# Patient Record
Sex: Female | Born: 1960
Health system: Southern US, Community
[De-identification: ages and names within clinical notes are randomized; demographics above are authoritative.]

## PROBLEM LIST (undated history)

## (undated) DIAGNOSIS — Z923 Personal history of irradiation: Secondary | ICD-10-CM

## (undated) DIAGNOSIS — C50919 Malignant neoplasm of unspecified site of unspecified female breast: Secondary | ICD-10-CM

## (undated) DIAGNOSIS — I10 Essential (primary) hypertension: Secondary | ICD-10-CM

## (undated) DIAGNOSIS — Z8669 Personal history of other diseases of the nervous system and sense organs: Secondary | ICD-10-CM

## (undated) DIAGNOSIS — Z460 Encounter for fitting and adjustment of spectacles and contact lenses: Secondary | ICD-10-CM

## (undated) DIAGNOSIS — M65319 Trigger thumb, unspecified thumb: Secondary | ICD-10-CM

## (undated) DIAGNOSIS — M1990 Unspecified osteoarthritis, unspecified site: Secondary | ICD-10-CM

## (undated) HISTORY — DX: Unspecified osteoarthritis, unspecified site: M19.90

## (undated) HISTORY — PX: COLONOSCOPY: SHX174

## (undated) HISTORY — DX: Essential (primary) hypertension: I10

## (undated) HISTORY — DX: Personal history of other diseases of the nervous system and sense organs: Z86.69

## (undated) HISTORY — DX: Trigger thumb, unspecified thumb: M65.319

## (undated) HISTORY — DX: Malignant neoplasm of unspecified site of unspecified female breast: C50.919

---
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
## 1991-01-31 HISTORY — PX: BREAST EXCISIONAL BIOPSY: SUR124

## 1999-05-12 ENCOUNTER — Other Ambulatory Visit: Admission: RE | Admit: 1999-05-12 | Discharge: 1999-05-12 | Payer: Self-pay | Admitting: Gynecology

## 1999-05-19 ENCOUNTER — Encounter (INDEPENDENT_AMBULATORY_CARE_PROVIDER_SITE_OTHER): Payer: Self-pay

## 1999-05-19 ENCOUNTER — Other Ambulatory Visit: Admission: RE | Admit: 1999-05-19 | Discharge: 1999-05-19 | Payer: Self-pay | Admitting: Gynecology

## 1999-06-09 ENCOUNTER — Other Ambulatory Visit: Admission: RE | Admit: 1999-06-09 | Discharge: 1999-06-09 | Payer: Self-pay | Admitting: Gynecology

## 1999-11-01 ENCOUNTER — Other Ambulatory Visit: Admission: RE | Admit: 1999-11-01 | Discharge: 1999-11-01 | Payer: Self-pay | Admitting: Gynecology

## 2000-05-08 ENCOUNTER — Other Ambulatory Visit: Admission: RE | Admit: 2000-05-08 | Discharge: 2000-05-08 | Payer: Self-pay | Admitting: Gynecology

## 2000-05-11 ENCOUNTER — Encounter (INDEPENDENT_AMBULATORY_CARE_PROVIDER_SITE_OTHER): Payer: Self-pay

## 2000-05-11 ENCOUNTER — Other Ambulatory Visit: Admission: RE | Admit: 2000-05-11 | Discharge: 2000-05-11 | Payer: Self-pay | Admitting: Gynecology

## 2000-11-19 ENCOUNTER — Other Ambulatory Visit: Admission: RE | Admit: 2000-11-19 | Discharge: 2000-11-19 | Payer: Self-pay | Admitting: Gynecology

## 2001-05-09 ENCOUNTER — Other Ambulatory Visit: Admission: RE | Admit: 2001-05-09 | Discharge: 2001-05-09 | Payer: Self-pay | Admitting: Gynecology

## 2002-07-02 ENCOUNTER — Other Ambulatory Visit: Admission: RE | Admit: 2002-07-02 | Discharge: 2002-07-02 | Payer: Self-pay | Admitting: Gynecology

## 2005-01-02 ENCOUNTER — Other Ambulatory Visit: Admission: RE | Admit: 2005-01-02 | Discharge: 2005-01-02 | Payer: Self-pay | Admitting: Gynecology

## 2005-01-30 HISTORY — PX: MINOR FULGERATION OF ANAL CONDYLOMA: SHX6467

## 2005-11-20 ENCOUNTER — Other Ambulatory Visit: Admission: RE | Admit: 2005-11-20 | Discharge: 2005-11-20 | Payer: Self-pay | Admitting: Gynecology

## 2005-12-07 ENCOUNTER — Ambulatory Visit (HOSPITAL_COMMUNITY): Admission: RE | Admit: 2005-12-07 | Discharge: 2005-12-07 | Payer: Self-pay | Admitting: General Surgery

## 2005-12-07 ENCOUNTER — Encounter (INDEPENDENT_AMBULATORY_CARE_PROVIDER_SITE_OTHER): Payer: Self-pay | Admitting: Specialist

## 2007-09-09 ENCOUNTER — Other Ambulatory Visit: Admission: RE | Admit: 2007-09-09 | Discharge: 2007-09-09 | Payer: Self-pay | Admitting: Gynecology

## 2009-09-21 ENCOUNTER — Ambulatory Visit (HOSPITAL_COMMUNITY): Admission: RE | Admit: 2009-09-21 | Discharge: 2009-09-21 | Payer: Self-pay | Admitting: Urology

## 2010-06-17 NOTE — Op Note (Signed)
NAMEJOHNI, NARINE                  ACCOUNT NO.:  [id number]   MEDICAL RECORD NO.:  [id number]          PATIENT TYPE:  AMB   LOCATION:  DAY                          FACILITY:  [facility]   PHYSICIAN:  Ollen Gross. Vernell Morgans, M.D. DATE OF BIRTH:  23-Nov-1960   DATE OF PROCEDURE:  12/07/2005  DATE OF DISCHARGE:                               OPERATIVE REPORT   PREOPERATIVE DIAGNOSIS:  Anal condyloma.   POSTOPERATIVE DIAGNOSIS:  Anal condyloma.   PROCEDURE:  Destruction of anal condyloma.   SURGEON:  Dr. Carolynne Edouard.   ANESTHESIA:  General via LMA.   PROCEDURE:  After informed consent was obtained, the patient was brought  to the operating room, placed in the supine position on the operating  room table.  After induction of general anesthesia, the patient was  moved into a lithotomy position.  Her perirectal area was prepped with  Betadine and draped in usual sterile manner.  The patient had several  areas of anal condyloma around the rectum.  The perirectal area was  infiltrated with quarter percent Marcaine with epinephrine, and tissue  was massaged gently for several minutes.  A bullet-type retractor was  then placed in the rectum.  There did not appear to be any disease that  extended into the rectum.  The three largest areas of anal condyloma  were excised sharply with Metzenbaum scissors and sent to pathology for  further evaluation.  All the areas were then fulgurated with  electrocautery until no other areas of anal condyloma were left.  Once  this was accomplished, the operative area was coated with dibucaine  ointment, and sterile dressings were applied.  The patient tolerated  well.  At the end of the case, all needle, sponge and instrument counts  were correct.  The patient was then awakened and taken to the recovery  in stable condition.      Ollen Gross. Vernell Morgans, M.D.  Electronically Signed     PST/MEDQ  D:  12/07/2005  T:  12/08/2005  Job:  [id number]

## 2010-09-20 ENCOUNTER — Other Ambulatory Visit: Payer: Self-pay | Admitting: Gynecology

## 2010-09-20 DIAGNOSIS — R928 Other abnormal and inconclusive findings on diagnostic imaging of breast: Secondary | ICD-10-CM

## 2010-09-26 ENCOUNTER — Ambulatory Visit
Admission: RE | Admit: 2010-09-26 | Discharge: 2010-09-26 | Disposition: A | Discharge status: Home / Self Care | Payer: BC Managed Care – PPO | Source: Ambulatory Visit | Attending: Gynecology | Admitting: Gynecology

## 2010-09-26 DIAGNOSIS — R928 Other abnormal and inconclusive findings on diagnostic imaging of breast: Secondary | ICD-10-CM

## 2011-01-31 HISTORY — PX: BUNIONECTOMY: SHX129

## 2012-09-24 ENCOUNTER — Other Ambulatory Visit: Payer: Self-pay | Admitting: Gynecology

## 2012-09-24 ENCOUNTER — Other Ambulatory Visit: Payer: Self-pay | Admitting: Internal Medicine

## 2012-09-24 DIAGNOSIS — Z803 Family history of malignant neoplasm of breast: Secondary | ICD-10-CM

## 2012-10-15 ENCOUNTER — Ambulatory Visit
Admission: RE | Admit: 2012-10-15 | Discharge: 2012-10-15 | Disposition: A | Discharge status: Home / Self Care | Payer: BC Managed Care – PPO | Source: Ambulatory Visit | Attending: Gynecology | Admitting: Gynecology

## 2012-10-15 DIAGNOSIS — Z803 Family history of malignant neoplasm of breast: Secondary | ICD-10-CM

## 2012-10-15 MED ORDER — GADOBENATE DIMEGLUMINE 529 MG/ML IV SOLN: 13.0000 mL | Freq: Once | INTRAVENOUS | Status: AC | PRN

## 2012-10-15 MED ORDER — GADOBENATE DIMEGLUMINE 529 MG/ML IV SOLN
13.0000 mL | Freq: Once | INTRAVENOUS | Status: AC | PRN
  Administered 2012-10-15: 13 mL via INTRAVENOUS

## 2012-10-16 ENCOUNTER — Other Ambulatory Visit: Payer: Self-pay | Admitting: Gynecology

## 2012-10-16 DIAGNOSIS — R928 Other abnormal and inconclusive findings on diagnostic imaging of breast: Secondary | ICD-10-CM

## 2012-10-21 ENCOUNTER — Ambulatory Visit
Admission: RE | Admit: 2012-10-21 | Discharge: 2012-10-21 | Disposition: A | Discharge status: Home / Self Care | Payer: BC Managed Care – PPO | Source: Ambulatory Visit | Attending: Gynecology | Admitting: Gynecology

## 2012-10-21 ENCOUNTER — Other Ambulatory Visit: Payer: Self-pay | Admitting: Gynecology

## 2012-10-21 DIAGNOSIS — R928 Other abnormal and inconclusive findings on diagnostic imaging of breast: Secondary | ICD-10-CM

## 2012-10-21 DIAGNOSIS — C50919 Malignant neoplasm of unspecified site of unspecified female breast: Secondary | ICD-10-CM | POA: Insufficient documentation

## 2012-10-24 ENCOUNTER — Telehealth: Payer: Self-pay | Admitting: *Deleted

## 2012-10-24 DIAGNOSIS — C50412 Malignant neoplasm of upper-outer quadrant of left female breast: Secondary | ICD-10-CM | POA: Insufficient documentation

## 2012-10-24 DIAGNOSIS — Z170 Estrogen receptor positive status [ER+]: Secondary | ICD-10-CM | POA: Insufficient documentation

## 2012-10-24 NOTE — Telephone Encounter (Signed)
Confirmed [facility] for 10/30/12 at 0800.  Instructions and contact information given. 

## 2012-10-30 ENCOUNTER — Ambulatory Visit (HOSPITAL_BASED_OUTPATIENT_CLINIC_OR_DEPARTMENT_OTHER): Payer: BC Managed Care – PPO | Admitting: Surgery

## 2012-10-30 ENCOUNTER — Ambulatory Visit: Payer: BC Managed Care – PPO | Attending: Surgery | Admitting: Physical Therapy

## 2012-10-30 ENCOUNTER — Encounter: Payer: Self-pay | Admitting: Oncology

## 2012-10-30 ENCOUNTER — Encounter (INDEPENDENT_AMBULATORY_CARE_PROVIDER_SITE_OTHER): Payer: Self-pay | Admitting: Surgery

## 2012-10-30 ENCOUNTER — Encounter: Payer: Self-pay | Admitting: *Deleted

## 2012-10-30 ENCOUNTER — Other Ambulatory Visit (HOSPITAL_BASED_OUTPATIENT_CLINIC_OR_DEPARTMENT_OTHER): Payer: BC Managed Care – PPO | Admitting: Lab

## 2012-10-30 ENCOUNTER — Ambulatory Visit: Payer: BC Managed Care – PPO

## 2012-10-30 ENCOUNTER — Ambulatory Visit
Admission: RE | Admit: 2012-10-30 | Discharge: 2012-10-30 | Disposition: A | Discharge status: Home / Self Care | Payer: BC Managed Care – PPO | Source: Ambulatory Visit | Attending: Radiation Oncology | Admitting: Radiation Oncology

## 2012-10-30 ENCOUNTER — Ambulatory Visit (HOSPITAL_BASED_OUTPATIENT_CLINIC_OR_DEPARTMENT_OTHER): Payer: BC Managed Care – PPO | Admitting: Oncology

## 2012-10-30 VITALS — BP 146/93 | HR 66 | Temp 97.5°F | Resp 20 | Ht 63.0 in | Wt 152.0 lb

## 2012-10-30 VITALS — BP 146/93 | HR 66 | Temp 97.5°F | Resp 20 | Ht 63.0 in | Wt 151.6 lb

## 2012-10-30 DIAGNOSIS — C50419 Malignant neoplasm of upper-outer quadrant of unspecified female breast: Secondary | ICD-10-CM

## 2012-10-30 DIAGNOSIS — Z170 Estrogen receptor positive status [ER+]: Secondary | ICD-10-CM

## 2012-10-30 DIAGNOSIS — C50412 Malignant neoplasm of upper-outer quadrant of left female breast: Secondary | ICD-10-CM

## 2012-10-30 DIAGNOSIS — C50919 Malignant neoplasm of unspecified site of unspecified female breast: Secondary | ICD-10-CM | POA: Insufficient documentation

## 2012-10-30 DIAGNOSIS — IMO0001 Reserved for inherently not codable concepts without codable children: Secondary | ICD-10-CM | POA: Insufficient documentation

## 2012-10-30 LAB — COMPREHENSIVE METABOLIC PANEL (CC13)
Albumin: 3.8 g/dL (ref 3.5–5.0)
Alkaline Phosphatase: 45 U/L (ref 40–150)
Calcium: 9.1 mg/dL (ref 8.4–10.4)
Glucose: 78 mg/dl (ref 70–140)

## 2012-10-30 LAB — CBC WITH DIFFERENTIAL/PLATELET
HCT: 42.4 % (ref 34.8–46.6)
MCH: 32 pg (ref 25.1–34.0)
NEUT#: 3.9 10*3/uL (ref 1.5–6.5)
NEUT%: 58.5 % (ref 38.4–76.8)
RBC: 4.59 10*6/uL (ref 3.70–5.45)
RDW: 12.8 % (ref 11.2–14.5)
WBC: 6.6 10*3/uL (ref 3.9–10.3)
lymph#: 2.3 10*3/uL (ref 0.9–3.3)

## 2012-10-30 NOTE — Progress Notes (Signed)
Checked in new pt with no financial concerns. °

## 2012-10-30 NOTE — Progress Notes (Signed)
[facility] Radiation Oncology NEW PATIENT EVALUATION  Name: Margaret Reynolds MRN: [id number]  Date:   10/30/2012           DOB: 1960/03/08  Status: outpatient   CC: Pcp Not In System  Streck, Reola Mosher, MD Dr. Shanon Payor [facility])   REFERRING PHYSICIAN: Jamey Ripa, Reola Mosher, MD   DIAGNOSIS: Stage I (T1, N0, M0) invasive ductal/DCIS of the left breast   HISTORY OF PRESENT ILLNESS:  Margaret Reynolds is a 52 y.o. female who is seen today at the [facility] for the courtesy Dr. Jamey Ripa for evaluation of her T1 N0 invasive ductal/DCIS of the left breast. Diagnostic mammograms have shown dense breasts. With her family history of breast cancer she had breast MR on 10/15/2012. This showed a 1.9 cm x 1.6 cm mass at 2:00 within the upper-outer quadrant of the left breast. There was also a 0.9 cm intramammary lymph node at 3:00 posteriorly, unchanged. A left axillary lymph node showed eccentric thickening, slightly suspicious. On 10/21/2012 she underwent a biopsy of the mass at 2:00 and also left axillary lymph node. The left axilla lymph node was benign. The 2:00 mass was diagnostic for invasive ductal/DCIS with calcifications. Her tumor was strongly ER positive at 100%, PR +100% with a low Ki-67 of 10%. HER-2/neu is pending. She seen today with Dr. Jamey Ripa and Dr. Welton Flakes. Of note is that her mother underwent a partial mastectomy and breast radiation by Dr. Clovis Riley at the [facility] approximately 2 years ago at age 8.  PREVIOUS RADIATION THERAPY: No   PAST MEDICAL HISTORY:  has a past medical history of Breast cancer; Hypertension; and Arthritis.     PAST SURGICAL HISTORY:  Past Surgical History  Procedure Laterality Date  . Breast lumpectomy      Age 49, 54     FAMILY HISTORY: family history includes Breast cancer in her mother; Cervical cancer in her maternal grandmother; Melanoma in her father. Her mother was treated following conservative surgery with  radiation therapy at the [facility] by Dr. Clovis Riley at age 20, 2 years ago. She is doing well.   SOCIAL HISTORY:  reports that she has quit smoking. She does not have any smokeless tobacco history on file. She reports that she drinks about 4.2 ounces of alcohol per week. She reports that she does not use illicit drugs. Married, 2 children, ages 29 and 72. She works as an Biochemist, clinical for a [employer]   ALLERGIES: Review of patient's allergies indicates no known allergies.   MEDICATIONS:  Current Outpatient Prescriptions  Medication Sig Dispense Refill  . Calcium Citrate-Vitamin D (CITRACAL + D PO) Take by mouth.      . enalapril (VASOTEC) 5 MG tablet Take 5 mg by mouth daily.       No current facility-administered medications for this encounter.     REVIEW OF SYSTEMS:  Pertinent items are noted in HPI.    PHYSICAL EXAM: Alert and oriented 52 year old white female appearing her stated age. Wt Readings from Last 3 Encounters:  10/30/12 152 lb (68.947 kg)  10/30/12 151 lb 9.6 oz (68.765 kg)   Temp Readings from Last 3 Encounters:  10/30/12 97.5 F (36.4 C)   10/30/12 97.5 F (36.4 C) Oral   BP Readings from Last 3 Encounters:  10/30/12 146/93  10/30/12 146/93   Pulse Readings from Last 3 Encounters:  10/30/12 66  10/30/12 66   Head and neck examination: Grossly unremarkable. Nodes:  Without palpable cervical, supraclavicular, or axillary lymphadenopathy. Chest: Lungs clear. Heart: Regular rate and rhythm. Back: Without spinal or CVA tenderness. Breasts: On inspection of left breast there is an area of ecchymosis at 2:00 from her needle biopsy wound. There is no palpable mass. Right breast without masses or lesions. Abdomen without hepatomegaly. Extremities: Without edema.    LABORATORY DATA:  Lab Results  Component Value Date   WBC 6.6 10/30/2012   HGB 14.7 10/30/2012   HCT 42.4 10/30/2012   MCV 92.4 10/30/2012   PLT 259 10/30/2012   Lab Results   Component Value Date   NA 140 10/30/2012   K 4.3 10/30/2012   CO2 26 10/30/2012   Lab Results  Component Value Date   ALT 22 10/30/2012   AST 21 10/30/2012   ALKPHOS 45 10/30/2012   BILITOT 0.62 10/30/2012      IMPRESSION: Stage I (T1, N0, M0) invasive ductal/DCIS of the left breast. I explained to the patient that her local treatment options include mastectomy versus partial mastectomy followed by radiation therapy. She appears to  be a candidate for hypo-fractionated radiation therapy. She can certainly be treated in [place]  by Dr. Clovis Riley. In the event that the cardiac silhouette is within the tangential fields, she could be treated in [place] with "deep inspiration/breath-hold" technology for this left-sided breast cancer. We briefly discussed the potential acute and late toxicities of radiation therapy. She'll proceed with conservative surgery with Dr. Jamey Ripa which will include a sentinel lymph node.   PLAN: As discussed above . An appointment will be made for her to see Dr. Clovis Riley following her definitive surgery. I spent 40 minutes minutes face to face with the patient and more than 50% of that time was spent in counseling and/or coordination of care.

## 2012-10-30 NOTE — Progress Notes (Addendum)
Patient ID: Margaret Reynolds, female   DOB: Mar 02, 1960, 52 y.o.   MRN: [id number]  Chief Complaint  Patient presents with  . Breast Cancer    Left    HPI Margaret Reynolds is a 52 y.o. female. She is referred by Dr Jeanie Sewer for consultation for management of her newly diagnosed breast cancer. A recent mammogram showed an abnormality in the upper outer quadrant of the left breast. A needle core biopsy has shown invasive ductal carcinoma, receptor positive, HER-2 negative, with a low growth phase. She is not having any breast symptoms and was unaware of a mass. She has had bilateral breast biopsies in the past, both for benign disease. Her mother was recently treated for breast cancer at approximately age 54. She is doing well.  The patient has no other breast tissue she concerns currently. She considers herself to be in good health with the exception of some mild increased blood pressure.  HPI  Past Medical History  Diagnosis Date  . Breast cancer   . Hypertension   . Arthritis     Past Surgical History  Procedure Laterality Date  . Breast lumpectomy      Age 5, 42    Family History  Problem Relation Age of Onset  . Breast cancer Mother   . Melanoma Father   . Cervical cancer Maternal Grandmother     Social History History  Substance Use Topics  . Smoking status: Former Games developer  . Smokeless tobacco: Not on file  . Alcohol Use: 4.2 oz/week    7 Cans of beer per week    No Known Allergies  Current Outpatient Prescriptions  Medication Sig Dispense Refill  . Calcium Citrate-Vitamin D (CITRACAL + D PO) Take by mouth.      . enalapril (VASOTEC) 5 MG tablet Take 5 mg by mouth daily.       No current facility-administered medications for this visit.    Review of Systems Review of Systems  Constitutional: Negative for fever, chills and unexpected weight change.  HENT: Negative for hearing loss, congestion, sore throat, trouble swallowing and voice change.   Eyes: Negative for  visual disturbance.  Respiratory: Negative for cough and wheezing.   Cardiovascular: Negative for chest pain, palpitations and leg swelling.  Gastrointestinal: Negative for nausea, vomiting, abdominal pain, diarrhea, constipation, blood in stool, abdominal distention and anal bleeding.  Genitourinary: Negative for hematuria, vaginal bleeding and difficulty urinating.  Musculoskeletal: Negative for arthralgias.  Skin: Negative for rash and wound.  Neurological: Negative for seizures, syncope and headaches.  Hematological: Negative for adenopathy. Does not bruise/bleed easily.  Psychiatric/Behavioral: Negative for confusion.       Some phobias    Blood pressure 146/93, pulse 66, temperature 97.5 F (36.4 C), resp. rate 20, height 5\' 3"  (1.6 m), weight 152 lb (68.947 kg).  Physical Exam Physical Exam  Vitals reviewed. Constitutional: She is oriented to person, place, and time. She appears well-developed and well-nourished. No distress.  HENT:  Head: Normocephalic and atraumatic.  Mouth/Throat: Oropharynx is clear and moist.  Eyes: Conjunctivae and EOM are normal. Pupils are equal, round, and reactive to light. No scleral icterus.  Neck: Normal range of motion. Neck supple. No tracheal deviation present. No thyromegaly present.  Cardiovascular: Normal rate, regular rhythm, normal heart sounds and intact distal pulses.  Exam reveals no gallop and no friction rub.   No murmur heard. Pulmonary/Chest: Effort normal and breath sounds normal. No respiratory distress. She has no wheezes. She has no  rales. Right breast exhibits no inverted nipple, no mass, no nipple discharge and no skin change. Left breast exhibits no inverted nipple, no mass, no nipple discharge, no skin change and no tenderness. Breasts are asymmetrical.    Abdominal: Soft. Bowel sounds are normal. She exhibits no distension and no mass. There is no tenderness. There is no rebound and no guarding.  Musculoskeletal: Normal range  of motion. She exhibits no edema and no tenderness.  Lymphadenopathy:    She has no cervical adenopathy.    She has no axillary adenopathy.       Right: No supraclavicular adenopathy present.       Left: No supraclavicular adenopathy present.  Neurological: She is alert and oriented to person, place, and time.  Skin: Skin is warm and dry. No rash noted. She is not diaphoretic. No erythema.  Psychiatric: She has a normal mood and affect. Her behavior is normal. Judgment and thought content normal.    Data Reviewed I have reviewed the mammogram and ultrasound films and reports and discuss them with her radiologist, I reviewed the MRI results and reviewed it with the radiologist. I have reviewed the pathology slides and discuss them with the pathologist. I also discussed her situation with both medical and radiation oncologist.  Assessment    Clinical stage I left breast cancer, upper outer quadrant, receptor positive, HER-2 negative Family history of breast cancer in her mother Hypertension well controlled     Plan    I have explained the pathophysiology and staging of breast cancer with particular attention to her exact situation. We discussed the multidisciplinary approach to breast cancer which often includes both medical and radiation oncology consultations.  We also discussed surgical options for the treatment of breast cancer including lumpectomy and mastectomy with possible reconstructive surgery. In addition we talked about the evaluation and management of lymph nodes including a description of sentinel lymph node biopsy and axillary dissections. We reviewed potential complications and risks including bleeding, infection, numbness,  lymphedema, and the potential need for additional surgery.  She understands that for patients who are candidate for lumpectomy or mastectomy there is an equal survival rate with either technique, but a slightly higher local recurrence rate with  lumpectomy. In addition she knows that a lumpectomy usually requires postoperative radiation as part of the management of the breast cancer.  We have discussed the likely postoperative course and plans for followup.  I have given the patient some written information that reviewed all of these issues. I believe her questions are answered and that she has a good understanding of the issues.   She is also scheduled for genetic testing. I recommended wire localized lumpectomy with sentinel lymph node dissection. This is her preferred option. She does not wish to wait until the results of genetic testing her back. We will therefore get her set up for outpatient surgery.  I have discussed the indications for the lumpectomy and described the procedure. She understand that the chance of removal of the abnormal area is very good, but that occasionally we are unable to locate it and may have to do a second procedure. We also discussed the possibility of a second procedure to get additional tissue. Risks of surgery such as bleeding and infection have also been explained, as well as the implications of not doing the surgery. She understands and wishes to proceed.        Linda Biehn J 10/30/2012, 11:01 AM   CC: Gwendlyn Deutscher II, MD

## 2012-10-30 NOTE — Patient Instructions (Signed)
We will schedule outpatient surgery to remove the breast cancer from the upper outer part of your left breast and also remove the sentinel lymph nodes from the left armpit. If you have not heard from my schedulers by late Friday, call the office on Monday to inquire about the delay. [phone]

## 2012-11-04 ENCOUNTER — Encounter: Payer: Self-pay | Admitting: *Deleted

## 2012-11-04 ENCOUNTER — Telehealth: Payer: Self-pay | Admitting: *Deleted

## 2012-11-04 NOTE — Telephone Encounter (Signed)
Britta Mccreedy called stating that Dr. Dayton Scrape referred pt to see Dr. Clovis Riley.  I faxed paperwork as requested to [facility].

## 2012-11-04 NOTE — Progress Notes (Signed)
[facility] Psychosocial Distress Screening Clinical Social Work  Patient completed distress screening protocol, and scored a 2 on the Psychosocial Distress Thermometer which indicates mild distress. Clinical Social Worker met with pt in [facility] to assess for distress and other psychosocial needs.  Pt stated she was confident in the physicians and her treatment plan, and had no needs at this time.  CSW acknowledge the emotion aspects of a cancer diagnosis and treatment, and informed pt of the support team and support services at [facility].  Pt's stated her mother had been diagnosed and treated 1 year ago, and identified her and other family/friends as support.  CSw provided support calendar and information, and encouraged pt to call with any questions or concerns.    Tamala Julian, MSW, LCSW Clinical Social Worker [facility] [phone]

## 2012-11-05 ENCOUNTER — Telehealth: Payer: Self-pay | Admitting: Oncology

## 2012-11-05 NOTE — Telephone Encounter (Signed)
S/w the pt and she is aware of her appt on 12/12/2012@12 :45pm.

## 2012-11-14 ENCOUNTER — Encounter (HOSPITAL_BASED_OUTPATIENT_CLINIC_OR_DEPARTMENT_OTHER): Payer: Self-pay | Admitting: *Deleted

## 2012-11-14 NOTE — Progress Notes (Signed)
Had labs cc 10/30/12-[facility] cmet-will need ekg-

## 2012-11-18 NOTE — Progress Notes (Signed)
CASSANDRA HARBOLD [id number] 1960/08/24 52 y.o. 11/18/2012 2:05 PM  CC  REDDING [name]., MD [address] [place] [address] Dr. Chipper Herb Dr. Cyndia Bent  REASON FOR CONSULTATION:  52 year old female with new diagnosis of left breast cancer. Patient was seen in the multidisciplinary breast clinic for discussion of treatment options.  STAGE:   Breast cancer of upper-outer quadrant of left female breast   Primary site: Breast (Left)   Staging method: AJCC 7th Edition   Clinical: Stage IA (T1c, N0, cM0)   Summary: Stage IA (T1c, N0, cM0)  REFERRING PHYSICIAN: Dr. Cyndia Bent  HISTORY OF PRESENT ILLNESS:  Margaret Reynolds is a 52 y.o. female.  With family history significant for mother with breast cancer at the age of 73. Patient has a history of dense breast she's been having high-risk mammography performed as well as MRIs. Recently she had an MRI of the breasts performed in the left breast at the 2:00 position there was noted to be a 1.9 cm mass nodes were within the left axilla were normal. She had an ultrasound performed and this confirmed at the 2:00 position a 1.2 cm area of concern. She underwent a biopsy. This showed a invasive ductal carcinoma grade 1 low grade node was negative. There was associated DCIS. Tumor was ER positive PR positive HER-2/neu negative with a proliferation marker Ki-67 of only 10%. Patient's case was discussed at the multidisciplinary breast conference her pathology and radiology were reviewed. Treatment plan was made. Patient was seen also by Dr. Chipper Herb and Dr. Cyndia Bent. Patient herself is without any significant complaints.   Past Medical History: Past Medical History  Diagnosis Date  . Breast cancer   . Hypertension   . Arthritis   . Contact lens/glasses fitting     wears contacts or glasses    Past Surgical History: Past Surgical History  Procedure Laterality Date  . Breast lumpectomy  9562,1308    lt br,rt    . Minor fulgeration of anal condyloma  2007  . Cesarean section    . Bunionectomy  2013    right foot  . Colonoscopy      Family History: Family History  Problem Relation Age of Onset  . Breast cancer Mother   . Melanoma Father   . Cervical cancer Maternal Grandmother     Social History History  Substance Use Topics  . Smoking status: Former Smoker    Quit date: 11/14/2008  . Smokeless tobacco: Not on file  . Alcohol Use: 4.2 oz/week    7 Cans of beer per week     Comment: occ    Allergies: No Known Allergies  Current Medications: Current Outpatient Prescriptions  Medication Sig Dispense Refill  . Calcium Citrate-Vitamin D (CITRACAL + D PO) Take by mouth.      . enalapril (VASOTEC) 5 MG tablet Take 5 mg by mouth daily.       No current facility-administered medications for this visit.    OB/GYN History:menarche at age 69 she underwent menopause 4 years ago she has been on hormone replacement therapy for 2 years she will discontinue this. First live birth was at 76  Fertility Discussion: not applicable Prior History of Cancer: no  Health Maintenance:  Colonoscopy 2012 Bone Density 2013 Last PAP smear August 2014  ECOG PERFORMANCE STATUS: 0 - Asymptomatic  Genetic Counseling/testing: patient will be referred to genetic counseling and testing  REVIEW OF SYSTEMS:  Comprehensive review of system was obtained (  14 point) and it is scant separately into the electronic medical record  PHYSICAL EXAMINATION: Blood pressure 146/93, pulse 66, temperature 97.5 F (36.4 C), temperature source Oral, resp. rate 20, height 5\' 3"  (1.6 m), weight 151 lb 9.6 oz (68.765 kg).  WGN:FAOZH, healthy, no distress, well nourished and well developed SKIN: skin color, texture, turgor are normal HEAD: Normocephalic EYES: PERRLA, EOMI, Conjunctiva are pink and non-injected EARS: External ears normal OROPHARYNX:no exudate, no erythema and lips, buccal mucosa, and tongue normal   NECK: no adenopathy, thyroid normal size, non-tender, without nodularity LYMPH:  no palpable lymphadenopathy, no hepatosplenomegaly BREAST:right breast normal without mass, skin or nipple changes or axillary nodes, left breast normal without mass, skin or nipple changes or axillary nodes LUNGS: clear to auscultation and percussion HEART: regular rate & rhythm ABDOMEN:abdomen soft, non-tender, normal bowel sounds and no masses or organomegaly BACK: Back symmetric, no curvature., No CVA tenderness EXTREMITIES:no edema, no clubbing, no cyanosis  NEURO: alert & oriented x 3 with fluent speech, no focal motor/sensory deficits, gait normal, reflexes normal and symmetric     STUDIES/RESULTS: US Breast Left  11-14-12   *RADIOLOGY REPORT*  Clinical Data:  Breast MRI demonstrated a mass within the left breast located at the 2 o'clock position.  Second look ultrasound was requested.  The patient has a family history of breast cancer in her mother at age 24.  LEFT BREAST ULTRASOUND  Comparison:  Previous examinations.  On physical exam, there is thickening located within the left breast at 2 o'clock position approximately 7 cm from the nipple. There is no palpable left axillary adenopathy.  Findings: Ultrasound is performed, showing an irregular hypoechoic mass located within the left breast at 2 o'clock position 7 cm from nipple corresponding to the MR finding.  This is associated with increased vascularity and mild shadowing.  The mass measures 1.2 x 1.1 x 0.8 cm in size. Biopsy is recommended. I have discussed ultrasound-guided core biopsy.  This will be performed and reported separately.  Ultrasound of the left axilla demonstrates a mildly prominent left axillary lymph node with some mild eccentric cortical thickening. Biopsy of this is also recommended and will be performed and reported separately.  IMPRESSION:  1.  1.2 cm irregular mass located within the left breast at 2 o'clock position 7 cm from  nipple worrisome for invasive mammary carcinoma.  Ultrasound-guided core biopsy is recommended. 2.  Mildly prominent left axillary lymph node with eccentric cortical thickening.  Biopsy is recommended and ultrasound-guided core biopsy will be performed and reported separately.  RECOMMENDATION: Left breast and left axillary lymph node ultrasound-guided core biopsies as discussed above.  I have discussed the findings and recommendations with the patient. Results were also provided in writing at the conclusion of the visit.  If applicable, a reminder letter will be sent to the patient regarding the next appointment.  BI-RADS CATEGORY 5:  Highly suggestive of malignancy - appropriate action should be taken.   Original Report Authenticated By: Rolla Plate, M.D.   Mm Digital Diagnostic Unilat L  11/14/2012   *RADIOLOGY REPORT*  Clinical Data:  Post left breast ultrasound guided core biopsy.  DIGITAL DIAGNOSTIC LEFT BREAST MAMMOGRAM  Comparison:  Previous exams.  Findings:  Mammographic images were obtained following ultrasound guided biopsy of  the mass located within the left breast at the two o'clock position. The ribbon shaped clip is in appropriate position.  IMPRESSION: Appropriate positioning of clip following left breast ultrasound guided core biopsy.  Final Assessment:  Post  Procedure Mammograms for Marker Placement   Original Report Authenticated By: Rolla Plate, M.D.   [place] Lt Breast Bx W Loc Dev 1st Lesion Img Bx Spec US Guide  10/22/2012   **ADDENDUM** CREATED: 10/22/2012 11:13:28  Histologic evaluation of the mass at 2 o'clock in the left breast reveals invasive ductal carcinoma and ductal carcinoma in situ. The carcinoma appears grade I. There is no evidence of carcinoma in the biopsied lymph node.  This is concordant with the imaging findings.  Results were discussed with the patient by telephone at her request.  She reports no complications from the procedure.  The patient has been scheduled  to be evaluated in the [facility] at the [facility] on 10/30/2012.  **END ADDENDUM** SIGNED BY: Cain Saupe, M.D.  10/21/2012   *RADIOLOGY REPORT*  Clinical Data:  Breast MRI demonstrates a mass within the left breast at 2 o'clock position.  A second look ultrasound demonstrates a suspicious mass within the left breast at 2 o'clock position and a mildly prominent left axillary lymph node with eccentric cortical thickening.  Biopsy recommended.  ULTRASOUND GUIDED CORE BIOPSY OF THE left BREAST  Comparison: Previous exams.  I met with the patient and we discussed the procedure of ultrasound- guided biopsy, including benefits and alternatives.  We discussed the high likelihood of a successful procedure. We discussed the risks of the procedure, including infection, bleeding, tissue injury, clip migration, and inadequate sampling.  Informed written consent was given. The usual time-out protocol was performed immediately prior to the procedure.  Using sterile technique and 2% Lidocaine as local anesthetic, under direct ultrasound visualization, a 14 gauge core biopsy device was used to perform biopsy of the mass located within the left breast at 2 o'clock position using a lateral approach.  At the conclusion of the procedure a ribbon shaped tissue marker clip was deployed into the biopsy cavity.  Follow up 2 view mammogram was performed and dictated separately.  IMPRESSION: Ultrasound guided biopsy of the left breast mass located at the 2 o'clock position as discussed above.  No apparent complications.  Original Report Authenticated By: Rolla Plate, M.D.   [place] Lt Breast Bx W Loc Dev Ea Add Lesion Img Bx Spec US Guide  10/22/2012   **ADDENDUM** CREATED: 10/22/2012 11:17:28  Histologic evaluation of the mass at 2 o'clock in the left breast reveals invasive ductal carcinoma and ductal carcinoma in situ. The carcinoma appears grade I. There is  no evidence of carcinoma in the biopsied lymph node.  This is concordant with the imaging findings.  Results were discussed with the patient by telephone at her request.  She reports no complications from the procedure.  The patient has been scheduled to be evaluated in the [facility] at the [facility] on 10/30/2012.  **END ADDENDUM** SIGNED BY: Cain Saupe, M.D.  10/21/2012   *RADIOLOGY REPORT*  Clinical Data:  Recent breast MRI demonstrated worrisome left breast mass located 2 o'clock position and mildly prominent left axillary lymph node.  Second look ultrasound demonstrated worrisome mass within the left breast at 2 o'clock position and mildly prominent left axillary lymph node with eccentric cortical thickening.  ULTRASOUND GUIDED CORE BIOPSY OF THE prominent left axillary lymph node  Comparison: Previous exams.  I met with the patient and we discussed the procedure of ultrasound- guided biopsy, including benefits and alternatives.  We discussed the high likelihood of a  successful procedure. We discussed the risks of the procedure, including infection, bleeding, tissue injury, clip migration, and inadequate sampling.  Informed written consent was given. The usual time-out protocol was performed immediately prior to the procedure.  Using sterile technique and 2% Lidocaine as local anesthetic, under direct ultrasound visualization, a 14 gauge core biopsy device was used to perform biopsy of prominent low left axillary lymph node using a lateral approach.  No clip was placed.  IMPRESSION: Ultrasound guided biopsy of the mildly prominent low level I left axillary lymph node as discussed above.  No apparent complications.  Original Report Authenticated By: Rolla Plate, M.D.     LABS:    Chemistry      Component Value Date/Time   NA 140 10/30/2012 0813   K 4.3 10/30/2012 0813   CO2 26 10/30/2012 0813   BUN 12.1 10/30/2012 0813    CREATININE 0.8 10/30/2012 0813      Component Value Date/Time   CALCIUM 9.1 10/30/2012 0813   ALKPHOS 45 10/30/2012 0813   AST 21 10/30/2012 0813   ALT 22 10/30/2012 0813   BILITOT 0.62 10/30/2012 0813      Lab Results  Component Value Date   WBC 6.6 10/30/2012   HGB 14.7 10/30/2012   HCT 42.4 10/30/2012   MCV 92.4 10/30/2012   PLT 259 10/30/2012   PATHOLOGY: ADDITIONAL INFORMATION: 1. PROGNOSTIC INDICATORS - ACIS Results: IMMUNOHISTOCHEMICAL AND MORPHOMETRIC ANALYSIS BY THE AUTOMATED CELLULAR IMAGING SYSTEM (ACIS) Estrogen Receptor: 100%, POSITIVE, STRONG STAINING INTENSITY Progesterone Receptor: 100%, POSITIVE, STRONG STAINING INTENSITY Proliferation Marker Ki67: 10% REFERENCE RANGE ESTROGEN RECEPTOR NEGATIVE <1% POSITIVE =>1% PROGESTERONE RECEPTOR NEGATIVE <1% POSITIVE =>1% All controls stained appropriately Abigail Miyamoto MD Pathologist, Electronic Signature ( Signed 10/25/2012) 1. CHROMOGENIC IN-SITU HYBRIDIZATION Results: HER-2/NEU BY CISH - NO AMPLIFICATION OF HER-2 DETECTED. RESULT RATIO OF HER2: CEP 17 SIGNALS 1.18 AVERAGE HER2 COPY NUMBER PER CELL 2 REFERENCE RANGE 1 of 3 FINAL for LEVETA, WAHAB [phone]) ADDITIONAL INFORMATION:(continued) NEGATIVE HER2/Chr17 Ratio <2.0 and Average HER2 copy number <4.0 EQUIVOCAL HER2/Chr17 Ratio <2.0 and Average HER2 copy number 4.0 and <6.0 POSITIVE HER2/Chr17 Ratio >=2.0 and/or Average HER2 copy number >=6.0 Abigail Miyamoto MD Pathologist, Electronic Signature ( Signed 10/24/2012) FINAL DIAGNOSIS Diagnosis 1. Breast, left, needle core biopsy, 2 o'clock - INVASIVE DUCTAL CARCINOMA. - DUCTAL CARCINOMA IN SITU WITH CALCIFICATIONS. - SEE COMMENT. 2. Lymph node, needle/core biopsy, left axillary 1 node - THERE IS NO EVIDENCE OF CARCINOMA IN 1 OF 1 LYMPH NODE (0/1). Microscopic Comment 1. The carcinoma appears grade I. A breast prognostic profile will be performed and the results reported separately. The results  were called to the [facility] on 10/22/2012. (JBK:kh 10/22/12) Pecola Leisure MD Pathologist, Electronic Signature (Case signed 10/22/2012)  ASSESSMENT    52 year old female with  #1 enrolled on high-risk protocol for MRIs due to dense breasts and family history. Patient was found to have a left breast mass at the 2:00 position measuring 1.9 cm with an axillary lymph node. Ultrasound revealed a 1.2 cm tumor biopsy showed a low-grade invasive ductal carcinoma lymph node was negative there was associated DCIS tumor was ER positive PR positive HER-2/neu negative with a low Ki-67 of 10%.  #2 patient's case was discussed at the multidisciplinary breast conference. I discussed her pathology and radiology today with her in detail. We discussed treatment options pathophysiology of breast cancer. We discussed adjuvant treatment with chemotherapy versus antiestrogen therapy. We discussed Oncotype DX testing. We also discussed  the genetics.  #3 patient was seen by radiation oncology as well as surgery. She is a good candidate for a lumpectomy with sentinel lymph node biopsy.  Clinical Trial Eligibility:no Multidisciplinary conference discussionyes     PLAN:    #1 patient will proceed with genetic counseling and testing do to her high risk nature.  #2 she initially was gone to have a lumpectomy with sentinel lymph node biopsy if her genetics are negative however if genetically she is positive for a breast gene then she may be a good candidate for bilateral mastectomies with reconstruction.  #3 patient and I discussed Oncotype DX testing after her for definitive surgery to determine whether or not she should receive chemotherapy versus antiestrogen therapy alone.  #4 certainly patient will need radiation therapy if she undergoes a lumpectomy only.        Discussion: Patient is being treated per NCCN breast cancer care guidelines appropriate for stage.I   Thank you so much for  allowing me to participate in the care of Margaret Reynolds. I will continue to follow up the patient with you and assist in her care.  All questions were answered. The patient knows to call the clinic with any problems, questions or concerns. We can certainly see the patient much sooner if necessary.  I spent 55 minutes counseling the patient face to face. The total time spent in the appointment was 60 minutes.  Drue Second, MD Medical/Oncology [facility] [phone] (beeper) [phone] (Office)

## 2012-11-19 ENCOUNTER — Encounter (HOSPITAL_BASED_OUTPATIENT_CLINIC_OR_DEPARTMENT_OTHER)
Admission: RE | Disposition: A | Discharge status: Home / Self Care | Payer: Self-pay | Source: Ambulatory Visit | Attending: Surgery

## 2012-11-19 ENCOUNTER — Ambulatory Visit
Admission: RE | Admit: 2012-11-19 | Discharge: 2012-11-19 | Disposition: A | Discharge status: Home / Self Care | Payer: BC Managed Care – PPO | Source: Ambulatory Visit | Attending: Surgery | Admitting: Surgery

## 2012-11-19 ENCOUNTER — Encounter (HOSPITAL_COMMUNITY)
Admission: RE | Admit: 2012-11-19 | Discharge: 2012-11-19 | Disposition: A | Discharge status: Home / Self Care | Payer: BC Managed Care – PPO | Source: Ambulatory Visit | Attending: Surgery | Admitting: Surgery

## 2012-11-19 ENCOUNTER — Ambulatory Visit (HOSPITAL_BASED_OUTPATIENT_CLINIC_OR_DEPARTMENT_OTHER)
Admission: RE | Admit: 2012-11-19 | Discharge: 2012-11-19 | Disposition: A | Discharge status: Home / Self Care | Payer: BC Managed Care – PPO | Source: Ambulatory Visit | Attending: Surgery | Admitting: Surgery

## 2012-11-19 ENCOUNTER — Encounter (HOSPITAL_BASED_OUTPATIENT_CLINIC_OR_DEPARTMENT_OTHER): Payer: BC Managed Care – PPO | Admitting: Anesthesiology

## 2012-11-19 ENCOUNTER — Ambulatory Visit (HOSPITAL_BASED_OUTPATIENT_CLINIC_OR_DEPARTMENT_OTHER): Payer: BC Managed Care – PPO | Admitting: Anesthesiology

## 2012-11-19 ENCOUNTER — Encounter (HOSPITAL_BASED_OUTPATIENT_CLINIC_OR_DEPARTMENT_OTHER): Payer: Self-pay | Admitting: *Deleted

## 2012-11-19 DIAGNOSIS — C50412 Malignant neoplasm of upper-outer quadrant of left female breast: Secondary | ICD-10-CM

## 2012-11-19 DIAGNOSIS — D0590 Unspecified type of carcinoma in situ of unspecified breast: Secondary | ICD-10-CM

## 2012-11-19 DIAGNOSIS — C50419 Malignant neoplasm of upper-outer quadrant of unspecified female breast: Secondary | ICD-10-CM | POA: Insufficient documentation

## 2012-11-19 DIAGNOSIS — I10 Essential (primary) hypertension: Secondary | ICD-10-CM | POA: Insufficient documentation

## 2012-11-19 DIAGNOSIS — Z87891 Personal history of nicotine dependence: Secondary | ICD-10-CM | POA: Insufficient documentation

## 2012-11-19 DIAGNOSIS — Z01812 Encounter for preprocedural laboratory examination: Secondary | ICD-10-CM | POA: Insufficient documentation

## 2012-11-19 DIAGNOSIS — Z170 Estrogen receptor positive status [ER+]: Secondary | ICD-10-CM | POA: Insufficient documentation

## 2012-11-19 DIAGNOSIS — M129 Arthropathy, unspecified: Secondary | ICD-10-CM | POA: Insufficient documentation

## 2012-11-19 HISTORY — PX: BREAST LUMPECTOMY WITH NEEDLE LOCALIZATION AND AXILLARY SENTINEL LYMPH NODE BX: SHX5760

## 2012-11-19 HISTORY — PX: BREAST LUMPECTOMY: SHX2

## 2012-11-19 HISTORY — DX: Encounter for fitting and adjustment of spectacles and contact lenses: Z46.0

## 2012-11-19 SURGERY — BREAST LUMPECTOMY WITH NEEDLE LOCALIZATION AND AXILLARY SENTINEL LYMPH NODE BX
Anesthesia: General | Site: Breast | Laterality: Left | Wound class: Clean

## 2012-11-19 MED ORDER — MIDAZOLAM HCL 2 MG/2ML IJ SOLN
1.0000 mg | INTRAMUSCULAR | Status: DC | PRN
  Administered 2012-11-19: 2 mg via INTRAVENOUS

## 2012-11-19 MED ORDER — CHLORHEXIDINE GLUCONATE 4 % EX LIQD: 1.0000 "application " | Freq: Once | CUTANEOUS | Status: DC

## 2012-11-19 MED ORDER — MIDAZOLAM HCL 2 MG/2ML IJ SOLN
INTRAMUSCULAR | Status: AC
  Filled 2012-11-19: qty 2

## 2012-11-19 MED ORDER — SODIUM CHLORIDE 0.9 % IJ SOLN
INTRAMUSCULAR | Status: AC
  Filled 2012-11-19: qty 10

## 2012-11-19 MED ORDER — TECHNETIUM TC 99M SULFUR COLLOID FILTERED
1.0000 | Freq: Once | INTRAVENOUS | Status: AC | PRN
  Administered 2012-11-19: 1 via INTRADERMAL

## 2012-11-19 MED ORDER — OXYCODONE HCL 5 MG PO TABS
5.0000 mg | ORAL_TABLET | Freq: Once | ORAL | Status: AC
  Administered 2012-11-19: 5 mg via ORAL

## 2012-11-19 MED ORDER — BUPIVACAINE HCL (PF) 0.25 % IJ SOLN
INTRAMUSCULAR | Status: DC | PRN
  Administered 2012-11-19: 20 mL
  Administered 2012-11-19: 10 mL

## 2012-11-19 MED ORDER — LACTATED RINGERS IV SOLN
INTRAVENOUS | Status: DC
  Administered 2012-11-19: 11:00:00 via INTRAVENOUS
  Administered 2012-11-19: 20 mL/h via INTRAVENOUS

## 2012-11-19 MED ORDER — MIDAZOLAM HCL 5 MG/5ML IJ SOLN
INTRAMUSCULAR | Status: DC | PRN
  Administered 2012-11-19: 2 mg via INTRAVENOUS

## 2012-11-19 MED ORDER — METHYLENE BLUE 1 % INJ SOLN
INTRAMUSCULAR | Status: AC
  Filled 2012-11-19: qty 10

## 2012-11-19 MED ORDER — EPHEDRINE SULFATE 50 MG/ML IJ SOLN
INTRAMUSCULAR | Status: DC | PRN
  Administered 2012-11-19 (×2): 10 mg via INTRAVENOUS

## 2012-11-19 MED ORDER — SODIUM CHLORIDE 0.9 % IJ SOLN
INTRAMUSCULAR | Status: DC | PRN
  Administered 2012-11-19: 10:00:00 via INTRAMUSCULAR

## 2012-11-19 MED ORDER — FENTANYL CITRATE 0.05 MG/ML IJ SOLN
INTRAMUSCULAR | Status: AC
  Filled 2012-11-19: qty 4

## 2012-11-19 MED ORDER — OXYCODONE-ACETAMINOPHEN 5-325 MG PO TABS: 1.0000 | ORAL_TABLET | ORAL | Status: DC | PRN

## 2012-11-19 MED ORDER — FENTANYL CITRATE 0.05 MG/ML IJ SOLN
INTRAMUSCULAR | Status: AC
  Filled 2012-11-19: qty 2

## 2012-11-19 MED ORDER — FENTANYL CITRATE 0.05 MG/ML IJ SOLN
INTRAMUSCULAR | Status: DC | PRN
  Administered 2012-11-19: 25 ug via INTRAVENOUS
  Administered 2012-11-19: 100 ug via INTRAVENOUS
  Administered 2012-11-19: 25 ug via INTRAVENOUS

## 2012-11-19 MED ORDER — HYDROMORPHONE HCL PF 1 MG/ML IJ SOLN
0.2500 mg | INTRAMUSCULAR | Status: DC | PRN
  Administered 2012-11-19 (×2): 0.5 mg via INTRAVENOUS

## 2012-11-19 MED ORDER — PROPOFOL 10 MG/ML IV BOLUS
INTRAVENOUS | Status: DC | PRN
  Administered 2012-11-19: 200 mg via INTRAVENOUS

## 2012-11-19 MED ORDER — HYDROMORPHONE HCL PF 1 MG/ML IJ SOLN
INTRAMUSCULAR | Status: AC
  Filled 2012-11-19: qty 1

## 2012-11-19 MED ORDER — OXYCODONE HCL 5 MG PO TABS
ORAL_TABLET | ORAL | Status: AC
  Filled 2012-11-19: qty 1

## 2012-11-19 MED ORDER — DEXAMETHASONE SODIUM PHOSPHATE 4 MG/ML IJ SOLN
INTRAMUSCULAR | Status: DC | PRN
  Administered 2012-11-19: 10 mg via INTRAVENOUS

## 2012-11-19 MED ORDER — LIDOCAINE HCL (CARDIAC) 20 MG/ML IV SOLN
INTRAVENOUS | Status: DC | PRN
  Administered 2012-11-19: 100 mg via INTRAVENOUS

## 2012-11-19 MED ORDER — CEFAZOLIN SODIUM-DEXTROSE 2-3 GM-% IV SOLR
2.0000 g | INTRAVENOUS | Status: AC
  Administered 2012-11-19: 2 g via INTRAVENOUS

## 2012-11-19 MED ORDER — FENTANYL CITRATE 0.05 MG/ML IJ SOLN
50.0000 ug | Freq: Once | INTRAMUSCULAR | Status: AC
  Administered 2012-11-19: 100 ug via INTRAVENOUS

## 2012-11-19 MED ORDER — ONDANSETRON HCL 4 MG/2ML IJ SOLN
INTRAMUSCULAR | Status: DC | PRN
  Administered 2012-11-19: 4 mg via INTRAMUSCULAR

## 2012-11-19 MED ORDER — BUPIVACAINE HCL (PF) 0.25 % IJ SOLN
INTRAMUSCULAR | Status: AC
  Filled 2012-11-19: qty 30

## 2012-11-19 MED ORDER — CEFAZOLIN SODIUM-DEXTROSE 2-3 GM-% IV SOLR
INTRAVENOUS | Status: AC
  Filled 2012-11-19: qty 50

## 2012-11-19 SURGICAL SUPPLY — 61 items
APPLIER CLIP 11 MED OPEN (CLIP)
APPLIER CLIP 9.375 MED OPEN (MISCELLANEOUS)
BINDER BREAST LRG (GAUZE/BANDAGES/DRESSINGS) ×2 IMPLANT
BLADE HEX COATED 2.75 (ELECTRODE) ×2 IMPLANT
BLADE SURG 15 STRL LF DISP TIS (BLADE) ×2 IMPLANT
BLADE SURG 15 STRL SS (BLADE) ×2
CANISTER SUCT 1200ML W/VALVE (MISCELLANEOUS) ×2 IMPLANT
CHLORAPREP W/TINT 26ML (MISCELLANEOUS) ×2 IMPLANT
CLIP APPLIE 11 MED OPEN (CLIP) IMPLANT
CLIP APPLIE 9.375 MED OPEN (MISCELLANEOUS) IMPLANT
CLIP TI MEDIUM 6 (CLIP) IMPLANT
CLIP TI WIDE RED SMALL 6 (CLIP) ×4 IMPLANT
COVER MAYO STAND STRL (DRAPES) ×2 IMPLANT
COVER PROBE 5X48 (MISCELLANEOUS)
COVER PROBE W GEL 5X96 (DRAPES) ×2 IMPLANT
COVER TABLE BACK 60X90 (DRAPES) ×2 IMPLANT
DECANTER SPIKE VIAL GLASS SM (MISCELLANEOUS) IMPLANT
DERMABOND ADVANCED (GAUZE/BANDAGES/DRESSINGS) ×2
DERMABOND ADVANCED .7 DNX12 (GAUZE/BANDAGES/DRESSINGS) ×2 IMPLANT
DEVICE DUBIN W/COMP PLATE 8390 (MISCELLANEOUS) ×2 IMPLANT
DRAIN CHANNEL 19F RND (DRAIN) IMPLANT
DRAPE LAPAROSCOPIC ABDOMINAL (DRAPES) ×2 IMPLANT
DRAPE SURG 17X23 STRL (DRAPES) ×2 IMPLANT
DRAPE UTILITY XL STRL (DRAPES) ×2 IMPLANT
DRSG EMULSION OIL 3X3 NADH (GAUZE/BANDAGES/DRESSINGS) ×4 IMPLANT
ELECT BLADE 4.0 EZ CLEAN MEGAD (MISCELLANEOUS)
ELECT COATED BLADE 2.86 ST (ELECTRODE) ×2 IMPLANT
ELECT REM PT RETURN 9FT ADLT (ELECTROSURGICAL) ×2
ELECTRODE BLDE 4.0 EZ CLN MEGD (MISCELLANEOUS) IMPLANT
ELECTRODE REM PT RTRN 9FT ADLT (ELECTROSURGICAL) ×1 IMPLANT
EVACUATOR SILICONE 100CC (DRAIN) IMPLANT
GLOVE BIOGEL PI IND STRL 7.0 (GLOVE) ×2 IMPLANT
GLOVE BIOGEL PI INDICATOR 7.0 (GLOVE) ×2
GLOVE ECLIPSE 6.5 STRL STRAW (GLOVE) ×2 IMPLANT
GLOVE EUDERMIC 7 POWDERFREE (GLOVE) ×4 IMPLANT
GOWN PREVENTION PLUS XLARGE (GOWN DISPOSABLE) ×6 IMPLANT
KIT CVR 48X5XPRB PLUP LF (MISCELLANEOUS) IMPLANT
KIT MARKER MARGIN INK (KITS) ×2 IMPLANT
NDL SAFETY ECLIPSE 18X1.5 (NEEDLE) ×1 IMPLANT
NEEDLE HYPO 18GX1.5 SHARP (NEEDLE) ×1
NEEDLE HYPO 25X1 1.5 SAFETY (NEEDLE) ×4 IMPLANT
NS IRRIG 1000ML POUR BTL (IV SOLUTION) ×2 IMPLANT
PACK BASIN DAY SURGERY FS (CUSTOM PROCEDURE TRAY) ×2 IMPLANT
PENCIL BUTTON HOLSTER BLD 10FT (ELECTRODE) ×2 IMPLANT
PIN SAFETY STERILE (MISCELLANEOUS) IMPLANT
SHEET MEDIUM DRAPE 40X70 STRL (DRAPES) ×2 IMPLANT
SLEEVE SCD COMPRESS KNEE MED (MISCELLANEOUS) ×2 IMPLANT
SPONGE GAUZE 4X4 12PLY (GAUZE/BANDAGES/DRESSINGS) IMPLANT
SPONGE INTESTINAL PEANUT (DISPOSABLE) IMPLANT
SPONGE LAP 18X18 X RAY DECT (DISPOSABLE) IMPLANT
SPONGE LAP 4X18 X RAY DECT (DISPOSABLE) ×4 IMPLANT
SUT ETHILON 2 0 FS 18 (SUTURE) IMPLANT
SUT ETHILON 3 0 FSL (SUTURE) IMPLANT
SUT MNCRL AB 4-0 PS2 18 (SUTURE) ×4 IMPLANT
SUT VIC AB 4-0 BRD 54 (SUTURE) IMPLANT
SUT VICRYL 3-0 CR8 SH (SUTURE) ×4 IMPLANT
SYR CONTROL 10ML LL (SYRINGE) ×4 IMPLANT
TOWEL OR 17X24 6PK STRL BLUE (TOWEL DISPOSABLE) ×2 IMPLANT
TOWEL OR NON WOVEN STRL DISP B (DISPOSABLE) IMPLANT
TUBE CONNECTING 20X1/4 (TUBING) ×2 IMPLANT
YANKAUER SUCT BULB TIP NO VENT (SUCTIONS) ×2 IMPLANT

## 2012-11-19 NOTE — Anesthesia Postprocedure Evaluation (Signed)
  Anesthesia Post-op Note  Patient: Margaret Reynolds  Procedure(s) Performed: Procedure(s): BREAST LUMPECTOMY WITH NEEDLE LOCALIZATION AND AXILLARY SENTINEL LYMPH NODE BIOPSY (Left)  Patient Location: PACU  Anesthesia Type:General  Level of Consciousness: awake  Airway and Oxygen Therapy: Patient Spontanous Breathing  Post-op Pain: mild  Post-op Assessment: Post-op Vital signs reviewed  Post-op Vital Signs: Reviewed  Complications: No apparent anesthesia complications

## 2012-11-19 NOTE — Transfer of Care (Signed)
Immediate Anesthesia Transfer of Care Note  Patient: Margaret Reynolds  Procedure(s) Performed: Procedure(s): BREAST LUMPECTOMY WITH NEEDLE LOCALIZATION AND AXILLARY SENTINEL LYMPH NODE BIOPSY (Left)  Patient Location: PACU  Anesthesia Type:General  Level of Consciousness: sedated  Airway & Oxygen Therapy: Patient Spontanous Breathing and Patient connected to face mask oxygen  Post-op Assessment: Report given to PACU RN and Post -op Vital signs reviewed and stable  Post vital signs: Reviewed and stable  Complications: No apparent anesthesia complications

## 2012-11-19 NOTE — Interval H&P Note (Signed)
History and Physical Interval Note:  11/19/2012 10:01 AM  Margaret Reynolds  has presented today for surgery, with the diagnosis of left breast cancer  The various methods of treatment have been discussed with the patient and family. After consideration of risks, benefits and other options for treatment, the patient has consented to  Procedure(s): BREAST LUMPECTOMY WITH NEEDLE LOCALIZATION AND AXILLARY SENTINEL LYMPH NODE BX (Left) as a surgical intervention .  The patient's history has been reviewed, patient examined, no change in status, stable for surgery.  I have reviewed the patient's chart and labs.  Questions were answered to the patient's satisfaction.   I have reviewed the wire loc films and marked her left breast as the operative side  Earon Rivest J

## 2012-11-19 NOTE — Anesthesia Preprocedure Evaluation (Signed)
Anesthesia Evaluation  Patient identified by MRN, date of birth, ID band  Reviewed: Allergy & Precautions, H&P , NPO status , Patient's Chart, lab work & pertinent test results  Airway Mallampati: II      Dental   Pulmonary neg pulmonary ROS,          Cardiovascular hypertension,     Neuro/Psych    GI/Hepatic negative GI ROS, Neg liver ROS,   Endo/Other  negative endocrine ROS  Renal/GU negative Renal ROS     Musculoskeletal   Abdominal   Peds  Hematology   Anesthesia Other Findings   Reproductive/Obstetrics                           Anesthesia Physical Anesthesia Plan  ASA: III  Anesthesia Plan: General   Post-op Pain Management:    Induction: Intravenous  Airway Management Planned: LMA  Additional Equipment:   Intra-op Plan:   Post-operative Plan:   Informed Consent: I have reviewed the patients History and Physical, chart, labs and discussed the procedure including the risks, benefits and alternatives for the proposed anesthesia with the patient or authorized representative who has indicated his/her understanding and acceptance.   Dental advisory given  Plan Discussed with: CRNA and Anesthesiologist  Anesthesia Plan Comments:         Anesthesia Quick Evaluation

## 2012-11-19 NOTE — Interval H&P Note (Signed)
History and Physical Interval Note:  11/19/2012 9:56 AM  Margaret Reynolds  has presented today for surgery, with the diagnosis of left breast cancer  The various methods of treatment have been discussed with the patient and family. After consideration of risks, benefits and other options for treatment, the patient has consented to  Procedure(s): BREAST LUMPECTOMY WITH NEEDLE LOCALIZATION AND AXILLARY SENTINEL LYMPH NODE BX (Left) as a surgical intervention .  The patient's history has been reviewed, patient examined, no change in status, stable for surgery.  I have reviewed the patient's chart and labs.  Questions were answered to the patient's satisfaction.     Roben Schliep J

## 2012-11-19 NOTE — H&P (View-Only) (Signed)
Patient ID: Margaret Reynolds, female   DOB: 04/17/1960, 52 y.o.   MRN: [id number]  Chief Complaint  Patient presents with  . Breast Cancer    Left    HPI Margaret Reynolds is a 52 y.o. female. She is referred by Dr Redding for consultation for management of her newly diagnosed breast cancer. A recent mammogram showed an abnormality in the upper outer quadrant of the left breast. A needle core biopsy has shown invasive ductal carcinoma, receptor positive, HER-2 negative, with a low growth phase. She is not having any breast symptoms and was unaware of a mass. She has had bilateral breast biopsies in the past, both for benign disease. Her mother was recently treated for breast cancer at approximately age 72. She is doing well.  The patient has no other breast tissue she concerns currently. She considers herself to be in good health with the exception of some mild increased blood pressure.  HPI  Past Medical History  Diagnosis Date  . Breast cancer   . Hypertension   . Arthritis     Past Surgical History  Procedure Laterality Date  . Breast lumpectomy      Age 21, 31    Family History  Problem Relation Age of Onset  . Breast cancer Mother   . Melanoma Father   . Cervical cancer Maternal Grandmother     Social History History  Substance Use Topics  . Smoking status: Former Smoker  . Smokeless tobacco: Not on file  . Alcohol Use: 4.2 oz/week    7 Cans of beer per week    No Known Allergies  Current Outpatient Prescriptions  Medication Sig Dispense Refill  . Calcium Citrate-Vitamin D (CITRACAL + D PO) Take by mouth.      . enalapril (VASOTEC) 5 MG tablet Take 5 mg by mouth daily.       No current facility-administered medications for this visit.    Review of Systems Review of Systems  Constitutional: Negative for fever, chills and unexpected weight change.  HENT: Negative for hearing loss, congestion, sore throat, trouble swallowing and voice change.   Eyes: Negative for  visual disturbance.  Respiratory: Negative for cough and wheezing.   Cardiovascular: Negative for chest pain, palpitations and leg swelling.  Gastrointestinal: Negative for nausea, vomiting, abdominal pain, diarrhea, constipation, blood in stool, abdominal distention and anal bleeding.  Genitourinary: Negative for hematuria, vaginal bleeding and difficulty urinating.  Musculoskeletal: Negative for arthralgias.  Skin: Negative for rash and wound.  Neurological: Negative for seizures, syncope and headaches.  Hematological: Negative for adenopathy. Does not bruise/bleed easily.  Psychiatric/Behavioral: Negative for confusion.       Some phobias    Blood pressure 146/93, pulse 66, temperature 97.5 F (36.4 C), resp. rate 20, height 5' 3" (1.6 m), weight 152 lb (68.947 kg).  Physical Exam Physical Exam  Vitals reviewed. Constitutional: She is oriented to person, place, and time. She appears well-developed and well-nourished. No distress.  HENT:  Head: Normocephalic and atraumatic.  Mouth/Throat: Oropharynx is clear and moist.  Eyes: Conjunctivae and EOM are normal. Pupils are equal, round, and reactive to light. No scleral icterus.  Neck: Normal range of motion. Neck supple. No tracheal deviation present. No thyromegaly present.  Cardiovascular: Normal rate, regular rhythm, normal heart sounds and intact distal pulses.  Exam reveals no gallop and no friction rub.   No murmur heard. Pulmonary/Chest: Effort normal and breath sounds normal. No respiratory distress. She has no wheezes. She has no   rales. Right breast exhibits no inverted nipple, no mass, no nipple discharge and no skin change. Left breast exhibits no inverted nipple, no mass, no nipple discharge, no skin change and no tenderness. Breasts are asymmetrical.    Abdominal: Soft. Bowel sounds are normal. She exhibits no distension and no mass. There is no tenderness. There is no rebound and no guarding.  Musculoskeletal: Normal range  of motion. She exhibits no edema and no tenderness.  Lymphadenopathy:    She has no cervical adenopathy.    She has no axillary adenopathy.       Right: No supraclavicular adenopathy present.       Left: No supraclavicular adenopathy present.  Neurological: She is alert and oriented to person, place, and time.  Skin: Skin is warm and dry. No rash noted. She is not diaphoretic. No erythema.  Psychiatric: She has a normal mood and affect. Her behavior is normal. Judgment and thought content normal.    Data Reviewed I have reviewed the mammogram and ultrasound films and reports and discuss them with her radiologist, I reviewed the MRI results and reviewed it with the radiologist. I have reviewed the pathology slides and discuss them with the pathologist. I also discussed her situation with both medical and radiation oncologist.  Assessment    Clinical stage I left breast cancer, upper outer quadrant, receptor positive, HER-2 negative Family history of breast cancer in her mother Hypertension well controlled     Plan    I have explained the pathophysiology and staging of breast cancer with particular attention to her exact situation. We discussed the multidisciplinary approach to breast cancer which often includes both medical and radiation oncology consultations.  We also discussed surgical options for the treatment of breast cancer including lumpectomy and mastectomy with possible reconstructive surgery. In addition we talked about the evaluation and management of lymph nodes including a description of sentinel lymph node biopsy and axillary dissections. We reviewed potential complications and risks including bleeding, infection, numbness,  lymphedema, and the potential need for additional surgery.  She understands that for patients who are candidate for lumpectomy or mastectomy there is an equal survival rate with either technique, but a slightly higher local recurrence rate with  lumpectomy. In addition she knows that a lumpectomy usually requires postoperative radiation as part of the management of the breast cancer.  We have discussed the likely postoperative course and plans for followup.  I have given the patient some written information that reviewed all of these issues. I believe her questions are answered and that she has a good understanding of the issues.   She is also scheduled for genetic testing. I recommended wire localized lumpectomy with sentinel lymph node dissection. This is her preferred option. She does not wish to wait until the results of genetic testing her back. We will therefore get her set up for outpatient surgery.  I have discussed the indications for the lumpectomy and described the procedure. She understand that the chance of removal of the abnormal area is very good, but that occasionally we are unable to locate it and may have to do a second procedure. We also discussed the possibility of a second procedure to get additional tissue. Risks of surgery such as bleeding and infection have also been explained, as well as the implications of not doing the surgery. She understands and wishes to proceed.        Nasim Habeeb J 10/30/2012, 11:01 AM   CC: John Redding II, MD 

## 2012-11-19 NOTE — Op Note (Signed)
Margaret Reynolds Oct 15, 1960 [id number] 11/04/2012  Preoperative diagnosis: invasive ductal cancer, left breast, upper outer quadrant, clinical stage I  Postoperative diagnosis: same  Procedure: wire localized left partial mastectomy with blue dye injection and left axillary sentinel lymph node dissection  Surgeon: Currie Paris, MD, FACS  Anesthesia: GA combined with regional for post-op pain   Clinical History and Indications: this patient was recently found to have a left breast cancer upper outer quadrant. After consultations with medical and radiation oncology, she elected to proceed to wire localized lumpectomy and sentinel lymph node evaluation.    Description of Procedure: the patient was seen in the preoperative area, the plans confirmed, the wire localizing films reviewed, and the left breast marked as the operative side. She was taken to the operating room and after satisfactory general anesthesia was obtained a timeout was done. 5 cc of dilute methylene blue was then injected in the left subareolar area and massaged in. A full prep and drape was then performed.  The guidewire entered in the upper outer quadrant and appear to track somewhat medial and somewhat superior. I made a curvilinear incision directly over the area that I thought the tumor was located based on the wire localizing films and the direction of the guidewire. I divided a little bit of subcutaneous tissue and then was able to palpate or thought was a cancer. Using the cautery I took a wide area around this in all directions going down to the chest wall. After was excised by palpation I was well around the tumor and beyond the tip of the guidewire. There was a little bit of fatty tissue left on the pectoralis muscle such as that is the final deep margin.  A specimen mammogram showed the clip in the middle of the main specimen. This was confirmed by radiology. I then made sure it was dry in the lumpectomy cavity,  infiltrated 20 cc of 0.25% plain Marcaine, placed clips to mark all of the margins, and closed the wound in layers with 3-0 Vicryl, 4-0 Monocryl subcuticular, and Dermabond on the skin.  Using the neoprobe identified a hot area in the left axilla and a transverse incision. I divided the subcutaneous tissue and placed a self-retaining retractor. Identified a blue lymph node by tracing a small blue lymphatic into it. This was grasped and I began to excise it with the cautery. As I was doing so identified a second bright blue lymph node that was adjacent and both were excised in toto. Once these 2 were removed, identified no other blue lymphatics or blue lymph nodes, was able to palpate no additional abnormal tissue in the axilla, and there were no additional hot areas in the axilla. 10 cc of 0.25% plain Marcaine was infiltrated and the incision closed in layers with 3-0 Vicryl, 4-0 Monocryl subcuticular, and Dermabond on the skin.  The patient tolerated the procedure well. There were no operative complications. Counts were correct. Blood loss was minimal.  Currie Paris, MD, FACS 11/19/2012 11:30 AM

## 2012-11-19 NOTE — Anesthesia Procedure Notes (Signed)
Procedure Name: LMA Insertion Date/Time: 11/19/2012 10:14 AM Performed by: Caren Macadam Pre-anesthesia Checklist: Patient identified, Emergency Drugs available, Suction available and Patient being monitored Patient Re-evaluated:Patient Re-evaluated prior to inductionOxygen Delivery Method: Circle System Utilized Preoxygenation: Pre-oxygenation with 100% oxygen Intubation Type: IV induction Ventilation: Mask ventilation without difficulty LMA: LMA inserted LMA Size: 4.0 Number of attempts: 1 Airway Equipment and Method: bite block Placement Confirmation: positive ETCO2 and breath sounds checked- equal and bilateral Tube secured with: Tape Dental Injury: Teeth and Oropharynx as per pre-operative assessment

## 2012-11-20 NOTE — Progress Notes (Signed)
Quick Note:  Tell the patient that her margins are OK and her lymph nodes are negative. I will discuss in detail in the office. ______ 

## 2012-11-21 ENCOUNTER — Telehealth (INDEPENDENT_AMBULATORY_CARE_PROVIDER_SITE_OTHER): Payer: Self-pay | Admitting: General Surgery

## 2012-11-21 ENCOUNTER — Encounter: Payer: Self-pay | Admitting: *Deleted

## 2012-11-21 NOTE — Progress Notes (Signed)
Oncotype Dx order sent to Pathology.  Order received by Dr. Welton Flakes. Requisition received by [facility] in path.  Sent PAC to [employer].

## 2012-11-21 NOTE — Telephone Encounter (Signed)
Patient aware path results are good. Lymph nodes negative and margins ok. She will follow up in the office at her scheduled appt and call with any questions prior.  

## 2012-11-21 NOTE — Telephone Encounter (Signed)
Message copied by Liliana Cline on Thu Nov 21, 2012  9:14 AM ------      Message from: Currie Paris      Created: Wed Nov 20, 2012 12:50 PM       Tell the patient that her margins are OK and her lymph nodes are negative. I will discuss in detail in the office. ------

## 2012-11-25 ENCOUNTER — Telehealth: Payer: Self-pay | Admitting: *Deleted

## 2012-11-25 ENCOUNTER — Encounter (HOSPITAL_BASED_OUTPATIENT_CLINIC_OR_DEPARTMENT_OTHER): Payer: Self-pay | Admitting: Surgery

## 2012-11-25 NOTE — Telephone Encounter (Signed)
Called to check on pt after surgery.  Pt relate she noted some swelling under her arm at incision site.  Informed pt to call [facility].  Pt relay she called and was given instructions.  Reiterated instructions from [facility].  Confirmed f/u with Dr. Welton Flakes in November.  Pt denies needs or concerns at this time.  Encourage pt to call with questions.  Received verbal understanding.  Contact information given.

## 2012-11-25 NOTE — Telephone Encounter (Signed)
PT.'S LEFT UNDERARM IS MORE SWOLLEN AND TIGHT. INSTRUCTED PT. TO CALL HER SURGEON, DR.STRECK. SHE VOICES UNDERSTANDING.

## 2012-11-27 ENCOUNTER — Encounter: Payer: Self-pay | Admitting: *Deleted

## 2012-11-27 NOTE — Progress Notes (Signed)
Faxed paperwork to [place] at [facility] for [facility].

## 2012-11-28 ENCOUNTER — Telehealth (INDEPENDENT_AMBULATORY_CARE_PROVIDER_SITE_OTHER): Payer: Self-pay | Admitting: General Surgery

## 2012-11-28 NOTE — Telephone Encounter (Signed)
Message copied by Liliana Cline on Thu Nov 28, 2012  2:16 PM ------      Message from: Zacarias Pontes      Created: Thu Nov 28, 2012 11:58 AM       Pt would like to know when she can return to work.Marland KitchenMarland KitchenShe has 1st po here with Dr Jamey Ripa on 11-11..I will call her back if you would like for me to or her # is [phone]  ------

## 2012-11-28 NOTE — Telephone Encounter (Signed)
Spoke with patient. She had lumpectomy with [facility] on 11/19/2012. She is having swelling under her arm and some pain. She is putting heat/ice. She did not want to be seen in urgent office and refused appt with Dr Jamey Ripa tomorrow but will call if worsens. Patient does embroidery work. I advised she could go back to work now if she felt up to it and to let us know if she needs a note. She will call back if needed.

## 2012-12-02 ENCOUNTER — Encounter: Payer: Self-pay | Admitting: *Deleted

## 2012-12-02 NOTE — Progress Notes (Signed)
Received Oncotype Dx results of 9.  Gave copy to MD.  Rochele Pages copy to Med Rec to scan.

## 2012-12-09 ENCOUNTER — Other Ambulatory Visit (INDEPENDENT_AMBULATORY_CARE_PROVIDER_SITE_OTHER): Payer: Self-pay | Admitting: Surgery

## 2012-12-10 ENCOUNTER — Encounter (INDEPENDENT_AMBULATORY_CARE_PROVIDER_SITE_OTHER): Payer: Self-pay | Admitting: Surgery

## 2012-12-10 ENCOUNTER — Ambulatory Visit (INDEPENDENT_AMBULATORY_CARE_PROVIDER_SITE_OTHER): Payer: BC Managed Care – PPO | Admitting: Surgery

## 2012-12-10 VITALS — BP 130/74 | HR 89 | Temp 97.6°F | Resp 18 | Wt 157.0 lb

## 2012-12-10 DIAGNOSIS — C50419 Malignant neoplasm of upper-outer quadrant of unspecified female breast: Secondary | ICD-10-CM

## 2012-12-10 DIAGNOSIS — C50412 Malignant neoplasm of upper-outer quadrant of left female breast: Secondary | ICD-10-CM

## 2012-12-10 MED ORDER — OXYCODONE-ACETAMINOPHEN 5-325 MG PO TABS: 1.0000 | ORAL_TABLET | ORAL | Status: DC | PRN

## 2012-12-10 NOTE — Patient Instructions (Signed)
See me Friday

## 2012-12-10 NOTE — Progress Notes (Signed)
Margaret Reynolds    [id number] 12/10/2012    1960/07/02   CC:   Chief Complaint  Patient presents with  . Routine Post Op    left lumpectomy     HPI:  The patient returns for post op follow-up. She underwent a left lumpectomy and sentinel lymph node evaluation on 11/19/2012.  She developed a fluid collection in the left axilla which has been uncomfortable. He apparently was red she saw her primary care physician who put her on doxycycline. It is less red. However the breast incision has looked completely fine. She is not having fevers or other systemic signs.  PE: VITAL SIGNS: BP 130/74  Pulse 89  Temp(Src) 97.6 F (36.4 C)  Resp 18  Wt 157 lb (71.215 kg)  Breast: The incision is healing nicely and there is no evidence of infection or hematoma. Axilla: She does have a fluid collection in the axilla with some very mild erythema. I aspirated 40 cc of clear fluid although it appeared to have some pockets as I had to move the needle around to get it fully decompressed. I think we'll need to follow this in a few days.    DATA REVIEWED: Pathology report: 1. A sample (block 1A) was sent to [facility] for Oncotype testing. The patient's recurrence score is 9. Those patients who had a recurrence score of 9 had an average rate of distant recurrence of 7%. (JBK:gt, 11/28/12) Pecola Leisure MD Pathologist, Electronic Signature ( Signed 11/28/2012) 1. CHROMOGENIC IN-SITU HYBRIDIZATION Results: HER-2/NEU BY CISH - NO AMPLIFICATION OF HER-2 DETECTED. RESULT RATIO OF HER2: CEP 17 SIGNALS 0.86 AVERAGE HER2 COPY NUMBER PER CELL 2.05 REFERENCE RANGE NEGATIVE HER2/Chr17 Ratio <2.0 and Average HER2 copy number <4.0 EQUIVOCAL HER2/Chr17 Ratio <2.0 and Average HER2 copy number 4.0 and <6.0 POSITIVE HER2/Chr17 Ratio >=2.0 and/or Average HER2 copy number >=6.0 Jimmy Picket MD Pathologist, Electronic Signature ( Signed 11/27/2012) FINAL DIAGNOSIS Diagnosis 1. Breast, lumpectomy, left -  INVASIVE DUCTAL CARCINOMA, GRADE I/III, SPANNING 1.9 CM. 1 of 4 FINAL for [place] [phone]) Diagnosis(continued) - DUCTAL CARCINOMA IN SITU WITH CALCIFICATIONS, LOW GRADE. - FLAT EPITHELIAL ATYPIA - THE SURGICAL RESECTION MARGINS ARE NEGATIVE FOR CARCINOMA. - SEE ONCOLOGY TABLE BELOW. 2. Breast, excision, left additional deep margin - BENIGN FIBROADIPOSE TISSUE. - THERE IS NO EVIDENCE OF MALIGNANCY. - SEE COMMENT. 3. Lymph node, sentinel, biopsy, left Axilla #1 - THERE IS NO EVIDENCE OF CARCINOMA IN 1 OF 1 LYMPH NODE (0/1). 4. Lymph node, sentinel, biopsy, left Axilla #2 - THERE IS NO EVIDENCE OF CARCINOMA IN 1 OF 1 LYMPH NODE (0/1)  IMPRESSION: Patient doing well, with the exception of a left axillary seroma  PLAN: Her next visit will be in 3 days to recheck the seroma. I gave the patient a copy of the pathology report and reviewed it with her .

## 2012-12-10 NOTE — Telephone Encounter (Signed)
Ok per Dr Jamey Ripa. Written and signed. Patient has appt today and will pick up RX at appt

## 2012-12-12 ENCOUNTER — Encounter: Payer: Self-pay | Admitting: Oncology

## 2012-12-12 ENCOUNTER — Telehealth: Payer: Self-pay | Admitting: Oncology

## 2012-12-12 ENCOUNTER — Ambulatory Visit (HOSPITAL_BASED_OUTPATIENT_CLINIC_OR_DEPARTMENT_OTHER): Payer: BC Managed Care – PPO | Admitting: Oncology

## 2012-12-12 VITALS — BP 132/79 | HR 81 | Temp 98.0°F | Resp 18 | Ht 63.0 in | Wt 155.3 lb

## 2012-12-12 DIAGNOSIS — Z170 Estrogen receptor positive status [ER+]: Secondary | ICD-10-CM

## 2012-12-12 DIAGNOSIS — C50419 Malignant neoplasm of upper-outer quadrant of unspecified female breast: Secondary | ICD-10-CM

## 2012-12-12 DIAGNOSIS — C50412 Malignant neoplasm of upper-outer quadrant of left female breast: Secondary | ICD-10-CM

## 2012-12-12 NOTE — Progress Notes (Signed)
MAKIYA JEUNE [id number] 08/13/1960 52 y.o. 12/12/2012 1:29 PM  CC  REDDING [name]., MD [address] [place] [address] Dr. Chipper Herb Dr. Cyndia Bent  Diagnosis:  52 year old female with new diagnosis of left breast cancer. Patient was seen in the multidisciplinary breast clinic for discussion of treatment options.  STAGE:   Breast cancer of upper-outer quadrant of left female breast   Primary site: Breast (Left)   Staging method: AJCC 7th Edition   Clinical: Stage IA (T1c, N0, cM0)   Summary: Stage IA (T1c, N0, cM0)  REFERRING PHYSICIAN: Dr. Cyndia Bent  Prior therapy:  MIRIAM LILES is a 52 y.o. female.  With  #1 family history significant for mother with breast cancer at the age of 5. Patient has a history of dense breast she's been having high-risk mammography performed as well as MRIs. Recently she had an MRI of the breasts performed in the left breast at the 2:00 position there was noted to be a 1.9 cm mass nodes were within the left axilla were normal. She had an ultrasound performed and this confirmed at the 2:00 position a 1.2 cm area of concern. She underwent a biopsy. This showed a invasive ductal carcinoma grade 1 low grade node was negative. There was associated DCIS. Tumor was ER positive PR positive HER-2/neu negative with a proliferation marker Ki-67 of only 10%.   #2 patient is now status post lumpectomy of the left breast with the final pathology revealing 1.9 cm grade 1 invasive ductal carcinoma ER positive PR positive sentinel nodes were negative for metastatic disease. Patient had Oncotype DX testing performed that revealed a breast cancer recurrence score of 9 giving her a 7% risk of distant recurrence with antiestrogen therapy alone. This is in the low risk category. Patient and I discussed this today. My recommendation would be to treat her with curative intent adjuvant antiestrogen therapy only after she completes her radiation  therapy.  Interval history: Patient is seen in followup today postoperatively she is doing well. She has minimal pain at the surgical site. She and I discussed her pathology in detail today including her Oncotype testing results. Today she denies any fevers chills night sweats headaches shortness of breath chest pains palpitations no myalgias and arthralgias. No fevers chills or night sweats no vaginal bleeding or discharge. Remainder of the 10 point review of systems is negative.  Current therapy: Proceed with radiation therapy by Dr. Chipper Herb  Past Medical History: Past Medical History  Diagnosis Date  . Breast cancer   . Hypertension   . Arthritis   . Contact lens/glasses fitting     wears contacts or glasses    Past Surgical History: Past Surgical History  Procedure Laterality Date  . Breast lumpectomy  [id number]    lt br,rt   . Minor fulgeration of anal condyloma  2007  . Cesarean section    . Bunionectomy  2013    right foot  . Colonoscopy    . Breast lumpectomy with needle localization and axillary sentinel lymph node bx Left 11/19/2012    Procedure: BREAST LUMPECTOMY WITH NEEDLE LOCALIZATION AND AXILLARY SENTINEL LYMPH NODE BIOPSY;  Surgeon: Currie Paris, MD;  Location: [facility];  Service: General;  Laterality: Left;    Family History: Family History  Problem Relation Age of Onset  . Breast cancer Mother   . Melanoma Father   . Cervical cancer Maternal Grandmother     Social History History  Substance  Use Topics  . Smoking status: Former Smoker    Quit date: 11/14/2008  . Smokeless tobacco: Not on file  . Alcohol Use: 4.2 oz/week    7 Cans of beer per week     Comment: occ    Allergies: No Known Allergies  Current Medications: Current Outpatient Prescriptions  Medication Sig Dispense Refill  . Calcium Citrate-Vitamin D (CITRACAL + D PO) Take by mouth.      . ciprofloxacin (CIPRO) 500 MG tablet       . enalapril (VASOTEC) 5  MG tablet Take 5 mg by mouth daily.      Marland Kitchen oxyCODONE (OXY IR/ROXICODONE) 5 MG immediate release tablet       . oxyCODONE-acetaminophen (ROXICET) 5-325 MG per tablet Take 1 tablet by mouth every 4 (four) hours as needed.  30 tablet  0   No current facility-administered medications for this visit.    OB/GYN History:menarche at age 89 she underwent menopause 4 years ago she has been on hormone replacement therapy for 2 years she will discontinue this. First live birth was at 24  Fertility Discussion: not applicable Prior History of Cancer: no  Health Maintenance:  Colonoscopy 2012 Bone Density 2013 Last PAP smear August 2014  ECOG PERFORMANCE STATUS: 0 - Asymptomatic  Genetic Counseling/testing: Patient underwent genetic testing and counseling  REVIEW OF SYSTEMS:  As noted in the history of present illness  PHYSICAL EXAMINATION: Blood pressure 132/79, pulse 81, temperature 98 F (36.7 C), temperature source Oral, resp. rate 18, height 5\' 3"  (1.6 m), weight 155 lb 4.8 oz (70.444 kg).  EAV:WUJWJ, healthy, no distress, well nourished and well developed SKIN: skin color, texture, turgor are normal HEAD: Normocephalic EYES: PERRLA, EOMI, Conjunctiva are pink and non-injected EARS: External ears normal OROPHARYNX:no exudate, no erythema and lips, buccal mucosa, and tongue normal  NECK: no adenopathy, thyroid normal size, non-tender, without nodularity LYMPH:  no palpable lymphadenopathy, no hepatosplenomegaly BREAST: Left breast reveals healing surgical scar no nodularity no evidence of infections. Right breast no masses or nipple discharge. LUNGS: clear to auscultation and percussion HEART: regular rate & rhythm ABDOMEN:abdomen soft, non-tender, normal bowel sounds and no masses or organomegaly BACK: Back symmetric, no curvature., No CVA tenderness EXTREMITIES:no edema, no clubbing, no cyanosis  NEURO: alert & oriented x 3 with fluent speech, no focal motor/sensory deficits, gait  normal, reflexes normal and symmetric     STUDIES/RESULTS: US Breast Left  November 01, 2012   *RADIOLOGY REPORT*  Clinical Data:  Breast MRI demonstrated a mass within the left breast located at the 2 o'clock position.  Second look ultrasound was requested.  The patient has a family history of breast cancer in her mother at age 75.  LEFT BREAST ULTRASOUND  Comparison:  Previous examinations.  On physical exam, there is thickening located within the left breast at 2 o'clock position approximately 7 cm from the nipple. There is no palpable left axillary adenopathy.  Findings: Ultrasound is performed, showing an irregular hypoechoic mass located within the left breast at 2 o'clock position 7 cm from nipple corresponding to the MR finding.  This is associated with increased vascularity and mild shadowing.  The mass measures 1.2 x 1.1 x 0.8 cm in size. Biopsy is recommended. I have discussed ultrasound-guided core biopsy.  This will be performed and reported separately.  Ultrasound of the left axilla demonstrates a mildly prominent left axillary lymph node with some mild eccentric cortical thickening. Biopsy of this is also recommended and will be performed and reported  separately.  IMPRESSION:  1.  1.2 cm irregular mass located within the left breast at 2 o'clock position 7 cm from nipple worrisome for invasive mammary carcinoma.  Ultrasound-guided core biopsy is recommended. 2.  Mildly prominent left axillary lymph node with eccentric cortical thickening.  Biopsy is recommended and ultrasound-guided core biopsy will be performed and reported separately.  RECOMMENDATION: Left breast and left axillary lymph node ultrasound-guided core biopsies as discussed above.  I have discussed the findings and recommendations with the patient. Results were also provided in writing at the conclusion of the visit.  If applicable, a reminder letter will be sent to the patient regarding the next appointment.  BI-RADS CATEGORY 5:  Highly  suggestive of malignancy - appropriate action should be taken.   Original Report Authenticated By: Rolla Plate, M.D.   Mm Digital Diagnostic Unilat L  10/21/2012   *RADIOLOGY REPORT*  Clinical Data:  Post left breast ultrasound guided core biopsy.  DIGITAL DIAGNOSTIC LEFT BREAST MAMMOGRAM  Comparison:  Previous exams.  Findings:  Mammographic images were obtained following ultrasound guided biopsy of  the mass located within the left breast at the two o'clock position. The ribbon shaped clip is in appropriate position.  IMPRESSION: Appropriate positioning of clip following left breast ultrasound guided core biopsy.  Final Assessment:  Post Procedure Mammograms for Marker Placement   Original Report Authenticated By: Rolla Plate, M.D.   [place] Lt Breast Bx W Loc Dev 1st Lesion Img Bx Spec US Guide  10/22/2012   **ADDENDUM** CREATED: 10/22/2012 11:13:28  Histologic evaluation of the mass at 2 o'clock in the left breast reveals invasive ductal carcinoma and ductal carcinoma in situ. The carcinoma appears grade I. There is no evidence of carcinoma in the biopsied lymph node.  This is concordant with the imaging findings.  Results were discussed with the patient by telephone at her request.  She reports no complications from the procedure.  The patient has been scheduled to be evaluated in the [facility] at the [facility] on 10/30/2012.  **END ADDENDUM** SIGNED BY: Cain Saupe, M.D.  10/21/2012   *RADIOLOGY REPORT*  Clinical Data:  Breast MRI demonstrates a mass within the left breast at 2 o'clock position.  A second look ultrasound demonstrates a suspicious mass within the left breast at 2 o'clock position and a mildly prominent left axillary lymph node with eccentric cortical thickening.  Biopsy recommended.  ULTRASOUND GUIDED CORE BIOPSY OF THE left BREAST  Comparison: Previous exams.  I met with the patient and we discussed the  procedure of ultrasound- guided biopsy, including benefits and alternatives.  We discussed the high likelihood of a successful procedure. We discussed the risks of the procedure, including infection, bleeding, tissue injury, clip migration, and inadequate sampling.  Informed written consent was given. The usual time-out protocol was performed immediately prior to the procedure.  Using sterile technique and 2% Lidocaine as local anesthetic, under direct ultrasound visualization, a 14 gauge core biopsy device was used to perform biopsy of the mass located within the left breast at 2 o'clock position using a lateral approach.  At the conclusion of the procedure a ribbon shaped tissue marker clip was deployed into the biopsy cavity.  Follow up 2 view mammogram was performed and dictated separately.  IMPRESSION: Ultrasound guided biopsy of the left breast mass located at the 2 o'clock position as discussed above.  No apparent complications.  Original Report Authenticated By: Rolla Plate, M.D.  [place] Lt Breast Bx W Loc Dev Ea Add Lesion Img Bx Spec US Guide  10/22/2012   **ADDENDUM** CREATED: 10/22/2012 11:17:28  Histologic evaluation of the mass at 2 o'clock in the left breast reveals invasive ductal carcinoma and ductal carcinoma in situ. The carcinoma appears grade I. There is no evidence of carcinoma in the biopsied lymph node.  This is concordant with the imaging findings.  Results were discussed with the patient by telephone at her request.  She reports no complications from the procedure.  The patient has been scheduled to be evaluated in the [facility] at the [facility] on 10/30/2012.  **END ADDENDUM** SIGNED BY: Cain Saupe, M.D.  10/21/2012   *RADIOLOGY REPORT*  Clinical Data:  Recent breast MRI demonstrated worrisome left breast mass located 2 o'clock position and mildly prominent left axillary lymph node.  Second look ultrasound  demonstrated worrisome mass within the left breast at 2 o'clock position and mildly prominent left axillary lymph node with eccentric cortical thickening.  ULTRASOUND GUIDED CORE BIOPSY OF THE prominent left axillary lymph node  Comparison: Previous exams.  I met with the patient and we discussed the procedure of ultrasound- guided biopsy, including benefits and alternatives.  We discussed the high likelihood of a successful procedure. We discussed the risks of the procedure, including infection, bleeding, tissue injury, clip migration, and inadequate sampling.  Informed written consent was given. The usual time-out protocol was performed immediately prior to the procedure.  Using sterile technique and 2% Lidocaine as local anesthetic, under direct ultrasound visualization, a 14 gauge core biopsy device was used to perform biopsy of prominent low left axillary lymph node using a lateral approach.  No clip was placed.  IMPRESSION: Ultrasound guided biopsy of the mildly prominent low level I left axillary lymph node as discussed above.  No apparent complications.  Original Report Authenticated By: Rolla Plate, M.D.     LABS:    Chemistry      Component Value Date/Time   NA 140 10/30/2012 0813   K 4.3 10/30/2012 0813   CO2 26 10/30/2012 0813   BUN 12.1 10/30/2012 0813   CREATININE 0.8 10/30/2012 0813      Component Value Date/Time   CALCIUM 9.1 10/30/2012 0813   ALKPHOS 45 10/30/2012 0813   AST 21 10/30/2012 0813   ALT 22 10/30/2012 0813   BILITOT 0.62 10/30/2012 0813      Lab Results  Component Value Date   WBC 6.6 10/30/2012   HGB 14.7 10/30/2012   HCT 42.4 10/30/2012   MCV 92.4 10/30/2012   PLT 259 10/30/2012   PATHOLOGY: ADDITIONAL INFORMATION: 1. A sample (block 1A) was sent to [facility] for Oncotype testing. The patient's recurrence score is 9. Those patients who had a recurrence score of 9 had an average rate of distant recurrence of 7%. (JBK:gt, 11/28/12) Pecola Leisure  MD Pathologist, Electronic Signature ( Signed 11/28/2012) 1. CHROMOGENIC IN-SITU HYBRIDIZATION Results: HER-2/NEU BY CISH - NO AMPLIFICATION OF HER-2 DETECTED. RESULT RATIO OF HER2: CEP 17 SIGNALS 0.86 AVERAGE HER2 COPY NUMBER PER CELL 2.05 REFERENCE RANGE NEGATIVE HER2/Chr17 Ratio <2.0 and Average HER2 copy number <4.0 EQUIVOCAL HER2/Chr17 Ratio <2.0 and Average HER2 copy number 4.0 and <6.0 POSITIVE HER2/Chr17 Ratio >=2.0 and/or Average HER2 copy number >=6.0 Jimmy Picket MD Pathologist, Electronic Signature ( Signed 11/27/2012) FINAL DIAGNOSIS Diagnosis 1. Breast, lumpectomy, left - INVASIVE DUCTAL CARCINOMA, GRADE I/III, SPANNING 1.9 CM. 1 of 4 FINAL for  [place], [place] W [phone]) Diagnosis(continued) - DUCTAL CARCINOMA IN SITU WITH CALCIFICATIONS, LOW GRADE. - FLAT EPITHELIAL ATYPIA - THE SURGICAL RESECTION MARGINS ARE NEGATIVE FOR CARCINOMA. - SEE ONCOLOGY TABLE BELOW. 2. Breast, excision, left additional deep margin - BENIGN FIBROADIPOSE TISSUE. - THERE IS NO EVIDENCE OF MALIGNANCY. - SEE COMMENT. 3. Lymph node, sentinel, biopsy, left Axilla #1 - THERE IS NO EVIDENCE OF CARCINOMA IN 1 OF 1 LYMPH NODE (0/1). 4. Lymph node, sentinel, biopsy, left Axilla #2 - THERE IS NO EVIDENCE OF CARCINOMA IN 1 OF 1 LYMPH NODE (0/1) Microscopic Comment 1. BREAST, INVASIVE TUMOR, WITH LYMPH NODE SAMPLING Specimen, including laterality and lymph node sampling (sentinel, non-sentinel): Left breast, additional deep margin and 2 sentinel nodes Procedure: Resection Histologic type: Ductal Grade: I Tubule formation: 2 Nuclear pleomorphism: 1 Mitotic: 1 Tumor size (gross measurement): 1.9 cm Margins: Invasive, distance to closest margin: 0.2 cm to the anterior and posterior margins of specimen #1 (gross measurement). In-situ, distance to closest margin: 0.2 cm to the anterior and posterior margins of specimen #1 (gross measurement). Lymphovascular invasion: Not identified Ductal  carcinoma in situ: Present Grade: Low grade Extensive intraductal component: Yes Lobular neoplasia: Not identified Tumor focality: Unifocal Treatment effect: N/A Extent of tumor: Confined to breast parenchyma Lymph nodes: Examined: 2 Sentinel 0 Non-sentinel 2 Total Lymph nodes with metastasis: 0 Breast prognostic profile: [phone] Estrogen receptor: 100%, strong staining intensity Progesterone receptor: 100%, strong staining intensity Her 2 neu: No amplification was detected. The ratio was 1.18. Her 2 neu by C[facility] will be repeated on the current case and the results reported separately. Ki-67: 10% Non-neoplastic breast: Fibrocystic changes with calcification, fibroadenomas and healing biopsy site TNM: pT1c, pN0 (JBK:kh 11-20-12) 2. The surgical resection margin(s) of the specimen were inked and microscopically evaluated. 2 of 4 FINAL for LEVETA, WAHAB ([id number]) Microscopic Comment(continued) Pecola Leisure MD Pathologist, Electronic Signature (Case signed 11/20/2012)  Either tamoxifen or ASSESSMENT    52 year old female with  #1 enrolled on high-risk protocol for MRIs due to dense breasts and family history. Patient was found to have a left breast mass at the 2:00 position measuring 1.9 cm with an axillary lymph node. Ultrasound revealed a 1.2 cm tumor biopsy showed a low-grade invasive ductal carcinoma lymph node was negative there was associated DCIS tumor was ER positive PR positive HER-2/neu negative with a low Ki-67 of 10%.  #2 patient originally was seen in the multidisciplinary breast clinic. She was recommended genetic counseling and testing which she did have performed. He is now status post lumpectomy with sentinel lymph node biopsy. Final pathology did reveal a T1 C. N0 invasive ductal carcinoma ER +100% PR +100% HER-2/neu negative with a proliferation marker Ki-67 of 10%. Tumor was grade 1. Her final pathologic staging is stage I. She also had Oncotype DX testing  performed that revealed a low risk recurrence score. Therefore she is a good candidate for radiation followed by antiestrogen therapy.  PLAN:  #1 proceed with radiation therapy I have sent a referral to Dr. Chipper Herb.  #2 once patient finishes radiation therapy we will plan on seeing her back and begin her on antiestrogen therapy with tamoxifen or an aromatase inhibitor. We discussed the rationale for either of these agents. Patient has been on hormone replacement therapy and she is uncertain whether she is menopausal or not. Because of her age and unknown menopausal status I do think that we will begin with tamoxifen 20 mg daily. We certainly will be checking her hormonal  status and once she does become postmenopausal we can always switch over to aromatase inhibitor.   Thank you so much for allowing me to participate in the care of DENEICE WACK. I will continue to follow up the patient with you and assist in her care.  All questions were answered. The patient knows to call the clinic with any problems, questions or concerns. We can certainly see the patient much sooner if necessary.  I spent 15 minutes counseling the patient face to face. The total time spent in the appointment was 30 minutes.  Drue Second, MD Medical/Oncology [facility] [phone] (beeper) [phone] (Office)

## 2012-12-13 ENCOUNTER — Encounter (INDEPENDENT_AMBULATORY_CARE_PROVIDER_SITE_OTHER): Payer: Self-pay | Admitting: Surgery

## 2012-12-13 ENCOUNTER — Ambulatory Visit (INDEPENDENT_AMBULATORY_CARE_PROVIDER_SITE_OTHER): Payer: BC Managed Care – PPO | Admitting: Surgery

## 2012-12-13 VITALS — BP 124/78 | HR 70 | Temp 97.2°F | Resp 14 | Ht 64.0 in | Wt 156.4 lb

## 2012-12-13 DIAGNOSIS — Z09 Encounter for follow-up examination after completed treatment for conditions other than malignant neoplasm: Secondary | ICD-10-CM

## 2012-12-13 MED ORDER — CIPROFLOXACIN HCL 500 MG PO TABS: 500.0000 mg | ORAL_TABLET | Freq: Two times a day (BID) | ORAL | Status: DC

## 2012-12-13 NOTE — Progress Notes (Signed)
CC:  Chief Complaint  Patient presents with  . Routine Post Op    reck lt axilla/ fluid   I saw her back a few days ago with a fair amount of stromal fluid in the axilla. She comes back for followup today. Does not feel as tight as it did on her prior visit.  On exam she does continue to have fluid. I aspirated 15 cc of clear fluid. This is all I could retrieve although there appears to be still some swelling of the axilla.  Plan: I will see her back again in a few days. We'll continue her antibiotics (Cipro).

## 2012-12-13 NOTE — Patient Instructions (Signed)
Continue the antibiotics See me again next week

## 2012-12-16 ENCOUNTER — Encounter: Payer: Self-pay | Admitting: *Deleted

## 2012-12-16 NOTE — Progress Notes (Signed)
Mailed after appt letter to pt. 

## 2012-12-19 ENCOUNTER — Ambulatory Visit (INDEPENDENT_AMBULATORY_CARE_PROVIDER_SITE_OTHER): Payer: BC Managed Care – PPO | Admitting: Surgery

## 2012-12-19 ENCOUNTER — Encounter (INDEPENDENT_AMBULATORY_CARE_PROVIDER_SITE_OTHER): Payer: Self-pay | Admitting: Surgery

## 2012-12-19 VITALS — BP 130/82 | HR 64 | Temp 98.6°F | Resp 16 | Ht 64.0 in | Wt 156.6 lb

## 2012-12-19 DIAGNOSIS — Z09 Encounter for follow-up examination after completed treatment for conditions other than malignant neoplasm: Secondary | ICD-10-CM

## 2012-12-19 DIAGNOSIS — IMO0002 Reserved for concepts with insufficient information to code with codable children: Secondary | ICD-10-CM

## 2012-12-19 DIAGNOSIS — IMO0001 Reserved for inherently not codable concepts without codable children: Secondary | ICD-10-CM

## 2012-12-19 NOTE — Progress Notes (Signed)
She comes back today for recheck. I aspirated some fluid from the axillary incision at the last visit she had improvement in the discomfort. I wasn't convinced that all was retrieved. She came back today and it's not as tight as it was but there still seems to be fluid there. I was able to aspirate 10 cc of clear serous fluid. Following that I did an ultrasound, and I can see what appears to be a complex fluid collection. Under ultrasound guidance was able to introduce a needle into it but didn't really retrieve any fluid so this may all be some reactive tissue.  Going to have her come back next week for another followup.

## 2012-12-20 ENCOUNTER — Ambulatory Visit (INDEPENDENT_AMBULATORY_CARE_PROVIDER_SITE_OTHER): Payer: BC Managed Care – PPO | Admitting: General Surgery

## 2012-12-20 ENCOUNTER — Encounter (INDEPENDENT_AMBULATORY_CARE_PROVIDER_SITE_OTHER): Payer: Self-pay | Admitting: General Surgery

## 2012-12-20 VITALS — BP 122/84 | HR 72 | Temp 99.3°F | Resp 14 | Ht 64.0 in | Wt 157.0 lb

## 2012-12-20 DIAGNOSIS — L0390 Cellulitis, unspecified: Secondary | ICD-10-CM

## 2012-12-20 DIAGNOSIS — L0291 Cutaneous abscess, unspecified: Secondary | ICD-10-CM

## 2012-12-20 MED ORDER — DOXYCYCLINE HYCLATE 50 MG PO CAPS: 100.0000 mg | ORAL_CAPSULE | Freq: Two times a day (BID) | ORAL | Status: DC

## 2012-12-20 NOTE — Progress Notes (Signed)
Subjective:     Patient ID: Margaret Reynolds, female   DOB: 20-Aug-1960, 52 y.o.   MRN: [id number]  HPI 52 year old Caucasian female status post left partial mastectomy with left sentinel lymph node biopsy at the end of October comes in because increasing swelling and redness in her axillary sentinel lymph node biopsy site. She has seen Dr. Jamey Ripa several times of surgery because of a seroma in that site. She has been on ciprofloxacin for several weeks. She was just seen in the office yesterday by Dr. Jamey Ripa. She denies any fever or chills  Review of Systems     Objective:   Physical Exam BP 122/84  Pulse 72  Temp(Src) 99.3 F (37.4 C) (Temporal)  Resp 14  Ht 5\' 4"  (1.626 m)  Wt 157 lb (71.215 kg)  BMI 26.94 kg/m2 No apparent distress, nontoxic Left breast-well-healed surgical incision Left axillary incision-subcutaneous swelling about the size of a lemon. There is cellulitis of mainly inferior to her transverse incision.    Assessment:     Status post left partial mastectomy with left sentinel lymph node biopsy Left axillary seroma with skin cellulitis     Plan:     I recommended stopping her Cipro since she has ongoing cellulitis. She was placed on doxycycline. I tried to aspirate the seroma several times after infiltrating local in the skin. This was also done with ultrasound guidance. I was only able to get about 15 cc of straw-colored fluid. There is still residual fluid. I was able to milk some of the fluid out through the skin puncture site. A dry dressing was applied. She has an appointment to followup on Tuesday. She may need to have this percutaneously drained Or opened if it fails to improve. She was instructed to call for worsening pain, redness or fever  Mary Sella. Andrey Campanile, MD, FACS General, Bariatric, & Minimally Invasive Surgery [facility], [place]

## 2012-12-20 NOTE — Patient Instructions (Signed)
Stop cipro Pick up new antibiotic Call if develop fever, worsening redness or pain

## 2012-12-24 ENCOUNTER — Encounter (INDEPENDENT_AMBULATORY_CARE_PROVIDER_SITE_OTHER): Payer: Self-pay | Admitting: Surgery

## 2012-12-24 ENCOUNTER — Ambulatory Visit (INDEPENDENT_AMBULATORY_CARE_PROVIDER_SITE_OTHER): Payer: BC Managed Care – PPO | Admitting: Surgery

## 2012-12-24 VITALS — BP 136/86 | HR 74 | Temp 97.4°F | Resp 14 | Ht 64.0 in | Wt 155.8 lb

## 2012-12-24 DIAGNOSIS — Z5189 Encounter for other specified aftercare: Secondary | ICD-10-CM

## 2012-12-24 DIAGNOSIS — IMO0001 Reserved for inherently not codable concepts without codable children: Secondary | ICD-10-CM

## 2012-12-24 NOTE — Progress Notes (Signed)
She comes back today for recheck. Patient seen in the ER  by Dr. Andrey Campanile AND HAD  tenderness and  redness at the axillary incision and was  placed on antibiotics asked after aspiration in the emergency room. She returns today for recheck. There is mild erythema around the incision. The patient states is improved. Under sterile conditions, I was able to aspirate 10 cc of clear serous fluid. most of this feels like soft tissue swelling. Have asked her to finish her antibiotics. Ice packs as needed. Going to have her come back next week for another followup.

## 2012-12-24 NOTE — Patient Instructions (Signed)
Ice packs for the swelling.  Finish antibiotics.

## 2012-12-31 ENCOUNTER — Ambulatory Visit (INDEPENDENT_AMBULATORY_CARE_PROVIDER_SITE_OTHER): Payer: BC Managed Care – PPO | Admitting: General Surgery

## 2012-12-31 ENCOUNTER — Encounter (INDEPENDENT_AMBULATORY_CARE_PROVIDER_SITE_OTHER): Payer: Self-pay | Admitting: General Surgery

## 2012-12-31 VITALS — BP 116/78 | HR 72 | Resp 14 | Ht 64.0 in | Wt 154.2 lb

## 2012-12-31 DIAGNOSIS — IMO0002 Reserved for concepts with insufficient information to code with codable children: Secondary | ICD-10-CM | POA: Insufficient documentation

## 2012-12-31 DIAGNOSIS — IMO0001 Reserved for inherently not codable concepts without codable children: Secondary | ICD-10-CM

## 2012-12-31 DIAGNOSIS — Z5189 Encounter for other specified aftercare: Secondary | ICD-10-CM

## 2012-12-31 NOTE — Progress Notes (Signed)
The patient comes in with a complaint of discomfort and fullness in her left axilla. She's had previous strains down from this area before home but nothing recently  After repairing the area with chlorhexidine prep and anesthetizing the area 1% Xylocaine with epinephrine I was able to draw 20 cc of serous fluid. No evidence of infection. He would although some redness on the skin in that area and I do not believe that the fluid wasn't infected or that the wound is infected. Did not start the patient on antibiotics. It was absolutely clear and cultures were not sent.  The patient is to keep her regularly scheduled appointment with Dr. Jamey Ripa on December 19. She is to call his before then if the redness should spread. She can continue to get her radiation treatment to her left breast.

## 2013-01-07 ENCOUNTER — Telehealth: Payer: Self-pay | Admitting: *Deleted

## 2013-01-07 NOTE — Telephone Encounter (Signed)
Called pt to inform her that Clydie Braun is not in the office on 12/15 and we need to reschedule her appt and I confirmed 01/14/13 appt w/ pt.

## 2013-01-13 ENCOUNTER — Other Ambulatory Visit: Payer: BC Managed Care – PPO | Admitting: Lab

## 2013-01-13 ENCOUNTER — Encounter: Payer: BC Managed Care – PPO | Admitting: Genetic Counselor

## 2013-01-14 ENCOUNTER — Other Ambulatory Visit: Payer: BC Managed Care – PPO

## 2013-01-14 ENCOUNTER — Encounter: Payer: Self-pay | Admitting: Genetic Counselor

## 2013-01-14 ENCOUNTER — Ambulatory Visit (HOSPITAL_BASED_OUTPATIENT_CLINIC_OR_DEPARTMENT_OTHER): Payer: BC Managed Care – PPO | Admitting: Genetic Counselor

## 2013-01-14 DIAGNOSIS — C50412 Malignant neoplasm of upper-outer quadrant of left female breast: Secondary | ICD-10-CM

## 2013-01-14 DIAGNOSIS — Z803 Family history of malignant neoplasm of breast: Secondary | ICD-10-CM

## 2013-01-14 DIAGNOSIS — Z808 Family history of malignant neoplasm of other organs or systems: Secondary | ICD-10-CM

## 2013-01-14 DIAGNOSIS — IMO0002 Reserved for concepts with insufficient information to code with codable children: Secondary | ICD-10-CM

## 2013-01-14 DIAGNOSIS — C50419 Malignant neoplasm of upper-outer quadrant of unspecified female breast: Secondary | ICD-10-CM

## 2013-01-14 NOTE — Progress Notes (Signed)
Dr.  Drue Second requested a consultation for genetic counseling and risk assessment for Margaret Reynolds, a 52 y.o. female, for discussion of her personal history of benign breast lumps and breast cancer and family history of melanoma and breast cancer.  She presents to clinic today to discuss the possibility of a genetic predisposition to cancer, and to further clarify her risks, as well as her family members' risks for cancer.   HISTORY OF PRESENT ILLNESS: In 2014, at the age of 37, Margaret Reynolds was diagnosed with invasive ductal carcinoma of the breast. This was treated with lumpectomy, radiation and possibly tamoxifen.  She has had two breast biopsies in the past at ages 78 and 4.  Her current tumor is ER+/PR+/Her2-.  Her son also had a benign breast tumor removed at age 51.    Past Medical History  Diagnosis Date  . Breast cancer   . Hypertension   . Arthritis   . Contact lens/glasses fitting     wears contacts or glasses    Past Surgical History  Procedure Laterality Date  . Breast lumpectomy  7829,5621    lt br,rt   . Minor fulgeration of anal condyloma  2007  . Cesarean section    . Bunionectomy  2013    right foot  . Colonoscopy    . Breast lumpectomy with needle localization and axillary sentinel lymph node bx Left 11/19/2012    Procedure: BREAST LUMPECTOMY WITH NEEDLE LOCALIZATION AND AXILLARY SENTINEL LYMPH NODE BIOPSY;  Surgeon: Currie Paris, MD;  Location: [facility];  Service: General;  Laterality: Left;    History   Social History  . Marital Status: Married    Spouse Name: N/A    Number of Children: N/A  . Years of Education: N/A   Social History Main Topics  . Smoking status: Former Smoker -- 0.30 packs/day for 30 years    Quit date: 11/14/2008  . Smokeless tobacco: None  . Alcohol Use: 4.2 oz/week    7 Cans of beer per week     Comment: occ  . Drug Use: No  . Sexual Activity: Yes   Other Topics Concern  . None   Social History  Narrative  . None    REPRODUCTIVE HISTORY AND PERSONAL RISK ASSESSMENT FACTORS: Menarche was at age 37.   postmenopausal Uterus Intact: yes Ovaries Intact: yes G2P2A0, first live birth at age 39  She has not previously undergone treatment for infertility.   Oral Contraceptive use: 2 years   She has used HRT in the past.    FAMILY HISTORY:  We obtained a detailed, 4-generation family history.  Significant diagnoses are listed below: Family History  Problem Relation Age of Onset  . Breast cancer Mother 67  . Melanoma Father 4  . Cervical cancer Maternal Grandmother 40  . Cancer Paternal Uncle     Cancer NOS    Patient's maternal ancestors are of [place] descent, and paternal ancestors are of [place] descent. There is no reported Ashkenazi Jewish ancestry. There is no known consanguinity.  GENETIC COUNSELING ASSESSMENT: Margaret Reynolds is a 52 y.o. female with a personal history of breast cancer and breast lumps and family history of breast cancer which somewhat suggestive of a familial breast cancer and predisposition to cancer. We, therefore, discussed and recommended the following at today's visit.   DISCUSSION: We reviewed the characteristics, features and inheritance patterns of hereditary cancer syndromes. We also discussed genetic testing, including the  appropriate family members to test, the process of testing, insurance coverage and turn-around-time for results. We reviewed features of hereditary cancer syndromes and explained that the cancer in her family is most consistent with familial form of breast cancer.  Familial breast cancer families tend to have older ages of onset and fewer affected family members than hereditary cancer syndrome families.  At this time she does not meet the medical criteria for genetic testing.  In order to estimate her chance of having a BRCA mutation, we used statistical models (Penn II and Myraid risk calculator) and laboratory data that  take into account her personal medical history, family history and ancestry.  Because each model is different, there can be a lot of variability in the risks they give.  Therefore, these numbers must be considered a rough range and not a precise risk of having a BRCA mutation.  These models estimate that she has approximately a 2.2-7% chance of having a mutation. Based on this assessment of her family and personal history, genetic testing is not recommended.  PLAN: After considering the risks, benefits, and limitations, MIKELLE MYRICK declined testing. We discussed the implications of a positive, negative and/ or variant of uncertain significance genetic test result. We encouraged LASAUNDRA RICHE to remain in contact with cancer genetics annually so that we can continuously update the family history and inform her of any changes in cancer genetics and testing that may be of benefit for her family. Cassaundra Rasch Rosa's questions were answered to her satisfaction today. Our contact information was provided should additional questions or concerns arise.  The patient was seen for a total of 60 minutes, greater than 50% of which was spent face-to-face counseling.  This note will also be sent to the referring provider via the electronic medical record. The patient will be supplied with a summary of this genetic counseling discussion as well as educational information on the discussed hereditary cancer syndromes following the conclusion of their visit.   Patient was discussed with Dr. Drue Second.   _______________________________________________________________________ For Office Staff:  Number of people involved in session: 2 Was an Intern/ student involved with case: no

## 2013-01-17 ENCOUNTER — Ambulatory Visit (INDEPENDENT_AMBULATORY_CARE_PROVIDER_SITE_OTHER): Payer: BC Managed Care – PPO | Admitting: Surgery

## 2013-01-17 ENCOUNTER — Encounter (INDEPENDENT_AMBULATORY_CARE_PROVIDER_SITE_OTHER): Payer: Self-pay | Admitting: Surgery

## 2013-01-17 VITALS — BP 130/90 | HR 76 | Temp 98.9°F | Resp 14 | Ht 64.0 in | Wt 154.2 lb

## 2013-01-17 DIAGNOSIS — N644 Mastodynia: Secondary | ICD-10-CM

## 2013-01-17 DIAGNOSIS — Z09 Encounter for follow-up examination after completed treatment for conditions other than malignant neoplasm: Secondary | ICD-10-CM

## 2013-01-17 NOTE — Patient Instructions (Signed)
We will need to see you again next week.

## 2013-01-17 NOTE — Addendum Note (Signed)
Addended by: Ethlyn Gallery on: 01/17/2013 01:06 PM   Modules accepted: Orders

## 2013-01-17 NOTE — Progress Notes (Signed)
She comes back for followup because of a seroma in her left axillary sentinel lymph node incision. We aspirated a couple of times. She's been on some antibiotics. She started her radiation therapy. She continues to feel a hard lump in the area. She is not having fevers or chills at home.  Exam Vital signs:BP 130/90  Pulse 76  Temp(Src) 98.9 F (37.2 C) (Temporal)  Resp 14  Ht 5\' 4"  (1.626 m)  Wt 154 lb 3.2 oz (69.945 kg)  BMI 26.46 kg/m2 General: The patient alert oriented healthy-appearing Axilla: There is a firm area continued in the left axilla at the site of her incision from the sentinel lymph node. There is a little bit of redness. It is a little bit tender. It is clearly smaller than when we first started dealing with this and did the initial postop period.  Ultrasound today: There appears to be residual fluid collection. I aspirated Under ultrasound guidance and got 16 cc of very clear fluid.Seem to get rid of all of the fluid but it may reaccumulate.  I have her come back next week.If she reaccumulate significantly I think we will need to put a seroma catheter in. The fluid does not appear to be infected but we will send for culture.

## 2013-01-20 LAB — WOUND CULTURE: Organism ID, Bacteria: NO GROWTH

## 2013-01-21 ENCOUNTER — Ambulatory Visit (INDEPENDENT_AMBULATORY_CARE_PROVIDER_SITE_OTHER): Payer: BC Managed Care – PPO | Admitting: Surgery

## 2013-01-21 VITALS — BP 124/76 | HR 62 | Temp 98.0°F | Resp 18 | Ht 66.0 in | Wt 155.0 lb

## 2013-01-21 DIAGNOSIS — IMO0001 Reserved for inherently not codable concepts without codable children: Secondary | ICD-10-CM

## 2013-01-21 DIAGNOSIS — Z5189 Encounter for other specified aftercare: Secondary | ICD-10-CM

## 2013-01-21 NOTE — Progress Notes (Signed)
URGENT Office Margaret Reynolds [name] 52 y.o.  Body mass index is 25.03 kg/(m^2).  Patient Active Problem List   Diagnosis Date Noted  . Seroma complicating a procedure 12/31/2012  . Breast cancer of upper-outer quadrant of left female breast 10/24/2012    No Known Allergies  Past Surgical History  Procedure Laterality Date  . Breast lumpectomy  3086,5784    lt br,rt   . Minor fulgeration of anal condyloma  2007  . Cesarean section    . Bunionectomy  2013    right foot  . Colonoscopy    . Breast lumpectomy with needle localization and axillary sentinel lymph node bx Left 11/19/2012    Procedure: BREAST LUMPECTOMY WITH NEEDLE LOCALIZATION AND AXILLARY SENTINEL LYMPH NODE BIOPSY;  Surgeon: Currie Paris, MD;  Location: [facility];  Service: General;  Laterality: Left;   REDDING [name]., MD No diagnosis found.  15 cc clear yellow seroma fluid aspirated from left axilla.  She is heading to [place] from here.  Will get her back in one week for a recheck with Dr. Jamey Ripa.  [name], MD, [facility] [phone] beeper [phone]  01/21/2013 3:26 PM

## 2013-01-21 NOTE — Patient Instructions (Signed)
Seroma °A seroma is a collection of fluid that looks like swelling or a mass on the body. Seromas form on the body where tissue has been injured or cut. They are most common after surgeries. Seromas vary in size. Some are small and painless. Others may become large and cause pain or discomfort. Many seromas go away on their own; the fluid is naturally absorbed by the body. Some may require the fluid to be drained through medical procedures.  °CAUSES  °Seromas form as the result of damage to tissue or the removal of tissue. This tissue damage may occur during surgery or because of an injury or trauma. When tissue is disrupted or removed, empty space is created. The body's natural defense system causes fluid to enter the empty space and form a seroma. °SYMPTOMS  °· Swelling at the site of a surgical cut (incision) or an injury. °· Drainage of clear fluid at the surgery or injury site. °· Possible discomfort or pain. °DIAGNOSIS  °Your caregiver will perform a physical exam. During the exam, the caregiver will press on the seroma using a hand or fingers (palpation). Various tests may be ordered to help confirm the diagnosis. These tests may include: °· Blood tests. °· Imaging tests such as ultrasonography or computed tomography (CT). °TREATMENT  °Sometimes seromas resolve on their own and drain naturally in the body. Your caregiver may monitor you to make sure the seroma does not cause any complications. °If your seroma does not resolve on its own, treatment may include: °· Using a needle to drain the fluid from the seroma (needle aspiration). °· Inserting a flexible tube (catheter) to drain the fluid. °· Applying a dressing, such as an elastic bandage or binder. °· Use of antibiotic medicines if the seroma becomes infected. °· In rare cases, surgery may be done to remove the seroma and repair the area. °HOME CARE INSTRUCTIONS °· Follow your caregiver's instructions regarding activity levels and any limitations on  movements. °· Only take over-the-counter or prescription medicines as directed by your caregiver. °· If your caregiver prescribes antibiotics, take them as directed. Finish them even if you start to feel better. °· Check your seroma every day for redness, warmth, or yellow drainage. °· Follow up with your caregiver as directed. °SEEK MEDICAL CARE IF: °· You develop a fever. °· You have pain, tenderness, redness, or warmth at the site of the seroma. °· You notice yellow drainage coming from the site of the seroma. °· Your seroma is getting bigger. °Document Released: 05/13/2012 Document Reviewed: 05/13/2012 °ExitCare® Patient Information ©2014 [employer]. ° °

## 2013-01-27 ENCOUNTER — Ambulatory Visit (INDEPENDENT_AMBULATORY_CARE_PROVIDER_SITE_OTHER): Payer: BC Managed Care – PPO | Admitting: Surgery

## 2013-01-27 ENCOUNTER — Encounter (INDEPENDENT_AMBULATORY_CARE_PROVIDER_SITE_OTHER): Payer: Self-pay | Admitting: Surgery

## 2013-01-27 VITALS — BP 144/100 | HR 76 | Temp 98.6°F | Resp 14 | Ht 64.0 in | Wt 156.6 lb

## 2013-01-27 DIAGNOSIS — Z09 Encounter for follow-up examination after completed treatment for conditions other than malignant neoplasm: Secondary | ICD-10-CM

## 2013-01-27 MED ORDER — OXYCODONE HCL 5 MG PO TABS: 5.0000 mg | ORAL_TABLET | ORAL | Status: DC | PRN

## 2013-01-27 NOTE — Progress Notes (Addendum)
Comes in for a recheck of a persistent seroma of the left axilla after SLN. She has almost finished radiation and has a lot of skin issues in the axilla from that.  She feels the seroma is stable, and the area is not enlarged from after her last aspiration last week.  On exam she has acute radiation changes with some of the most acute areas in the axilla. There is still some swelling presnet mbut sof, not tender, other than the skin tenderness, and clearly smaller than the last time I saw her.  Did not feel there was enough to try to aspirate today.  Will have her follow up with Dr Marvene Staff in about ten days. Sooner if she feels increasing fluid in the axilla Refilled pain meds

## 2013-01-27 NOTE — Addendum Note (Signed)
Addended by: Currie Paris on: 01/27/2013 11:03 AM   Modules accepted: Orders

## 2013-02-07 ENCOUNTER — Ambulatory Visit (INDEPENDENT_AMBULATORY_CARE_PROVIDER_SITE_OTHER): Payer: BC Managed Care – PPO | Admitting: General Surgery

## 2013-02-07 VITALS — BP 118/72 | HR 66 | Temp 97.8°F | Resp 16 | Ht 64.0 in | Wt 155.6 lb

## 2013-02-07 DIAGNOSIS — T889XXS Complication of surgical and medical care, unspecified, sequela: Secondary | ICD-10-CM

## 2013-02-07 DIAGNOSIS — T792XXS Traumatic secondary and recurrent hemorrhage and seroma, sequela: Principal | ICD-10-CM

## 2013-02-07 DIAGNOSIS — T888XXS Other specified complications of surgical and medical care, not elsewhere classified, sequela: Principal | ICD-10-CM

## 2013-02-07 DIAGNOSIS — IMO0001 Reserved for inherently not codable concepts without codable children: Secondary | ICD-10-CM

## 2013-02-07 NOTE — Progress Notes (Signed)
History: Patient returns for followup of her left axillary seroma after sentinel node biopsy. She has completed radiation therapy. She feels there is more swelling there and it is somewhat more uncomfortable  Exam: In the left axilla is about 4 cm mass consistent with seroma.  I aspirated this and got about 12 cc of clear serous fluid with resolution. Continued followup for now and she will return in 4 weeks.

## 2013-03-06 ENCOUNTER — Ambulatory Visit (INDEPENDENT_AMBULATORY_CARE_PROVIDER_SITE_OTHER): Payer: BC Managed Care – PPO | Admitting: General Surgery

## 2013-03-06 ENCOUNTER — Encounter (INDEPENDENT_AMBULATORY_CARE_PROVIDER_SITE_OTHER): Payer: Self-pay | Admitting: General Surgery

## 2013-03-06 VITALS — BP 122/61 | HR 68 | Temp 98.1°F | Resp 18 | Ht 64.0 in | Wt 155.2 lb

## 2013-03-06 DIAGNOSIS — Z09 Encounter for follow-up examination after completed treatment for conditions other than malignant neoplasm: Secondary | ICD-10-CM

## 2013-03-06 NOTE — Progress Notes (Signed)
Chief complaint: Followup lumpectomy, Sentinel nodes  Patient returns for a scheduled followup for left axillary seroma postop. She reports she had symptom improvement after aspiration has some soreness of the arm again.  Exam: There is a palpable recurrent seroma left axilla which feels somewhat smaller than the last visit.  After discussion we elected to go ahead with aspiration today. I obtained about 9 cc of straw-colored fluid with resolution. She'll return for recheck in 6 weeks.

## 2013-03-13 ENCOUNTER — Telehealth (INDEPENDENT_AMBULATORY_CARE_PROVIDER_SITE_OTHER): Payer: Self-pay

## 2013-03-13 NOTE — Telephone Encounter (Signed)
Called and spoke to patient to make aware of 6 week follow up appointment scheduled for 04/23/13 @ 4:15pm w/Dr. Excell Seltzer.

## 2013-03-13 NOTE — Telephone Encounter (Signed)
Message copied by Ivor Costa on Thu Mar 13, 2013  3:33 PM ------      Message from: Zenovia Jordan      Created: Wed Mar 12, 2013 11:43 AM      Regarding: FW: PO Appt Needed In (6) Wks      Contact: [phone]       DR. Hoxworth's patient        ----- Message -----         From: Hunt Oris         Sent: 03/06/2013   1:00 PM           To: Zenovia Jordan, LPN      Subject: PO Appt Needed In (6) Wks                                Pt is expected back around 04/17/13-was not able to setup an appt because there were no open slots available for that specific date. Pt is looking for a date on a Friday between 11 am -12pm if possible. Please call pt with an appt.....df       ------

## 2013-03-19 ENCOUNTER — Telehealth: Payer: Self-pay | Admitting: *Deleted

## 2013-03-19 NOTE — Telephone Encounter (Signed)
Received an Production designer, theatre/television/film letter requesting for Office notes before Oncotype Dx test requested and notes for after results was released.  Faxed all notes to Corrin at [facility].

## 2013-03-20 ENCOUNTER — Encounter: Payer: Self-pay | Admitting: Oncology

## 2013-03-20 ENCOUNTER — Other Ambulatory Visit (HOSPITAL_BASED_OUTPATIENT_CLINIC_OR_DEPARTMENT_OTHER): Payer: BC Managed Care – PPO

## 2013-03-20 ENCOUNTER — Ambulatory Visit (HOSPITAL_BASED_OUTPATIENT_CLINIC_OR_DEPARTMENT_OTHER): Payer: BC Managed Care – PPO | Admitting: Oncology

## 2013-03-20 VITALS — BP 141/89 | HR 64 | Temp 97.7°F | Resp 20 | Ht 64.0 in | Wt 158.4 lb

## 2013-03-20 DIAGNOSIS — C50412 Malignant neoplasm of upper-outer quadrant of left female breast: Secondary | ICD-10-CM

## 2013-03-20 DIAGNOSIS — C50419 Malignant neoplasm of upper-outer quadrant of unspecified female breast: Secondary | ICD-10-CM

## 2013-03-20 DIAGNOSIS — I10 Essential (primary) hypertension: Secondary | ICD-10-CM

## 2013-03-20 DIAGNOSIS — Z170 Estrogen receptor positive status [ER+]: Secondary | ICD-10-CM

## 2013-03-20 LAB — COMPREHENSIVE METABOLIC PANEL (CC13)
ALBUMIN: 4.3 g/dL (ref 3.5–5.0)
ALT: 19 U/L (ref 0–55)
AST: 16 U/L (ref 5–34)
Alkaline Phosphatase: 52 U/L (ref 40–150)
Anion Gap: 10 mEq/L (ref 3–11)
BUN: 13.4 mg/dL (ref 7.0–26.0)
CALCIUM: 10.7 mg/dL — AB (ref 8.4–10.4)
CHLORIDE: 105 meq/L (ref 98–109)
CO2: 27 mEq/L (ref 22–29)
Creatinine: 0.8 mg/dL (ref 0.6–1.1)
GLUCOSE: 79 mg/dL (ref 70–140)
POTASSIUM: 4.6 meq/L (ref 3.5–5.1)
Sodium: 142 mEq/L (ref 136–145)
Total Bilirubin: 0.63 mg/dL (ref 0.20–1.20)
Total Protein: 7.1 g/dL (ref 6.4–8.3)

## 2013-03-20 LAB — CBC WITH DIFFERENTIAL/PLATELET
BASO%: 0.3 % (ref 0.0–2.0)
Basophils Absolute: 0 10*3/uL (ref 0.0–0.1)
EOS%: 2 % (ref 0.0–7.0)
Eosinophils Absolute: 0.1 10*3/uL (ref 0.0–0.5)
HCT: 42.4 % (ref 34.8–46.6)
HGB: 14.5 g/dL (ref 11.6–15.9)
LYMPH#: 0.9 10*3/uL (ref 0.9–3.3)
LYMPH%: 12.8 % — ABNORMAL LOW (ref 14.0–49.7)
MCH: 30.4 pg (ref 25.1–34.0)
MCHC: 34.2 g/dL (ref 31.5–36.0)
MCV: 88.9 fL (ref 79.5–101.0)
MONO#: 0.5 10*3/uL (ref 0.1–0.9)
MONO%: 6.9 % (ref 0.0–14.0)
NEUT%: 78 % — ABNORMAL HIGH (ref 38.4–76.8)
NEUTROS ABS: 5.6 10*3/uL (ref 1.5–6.5)
Platelets: 235 10*3/uL (ref 145–400)
RBC: 4.77 10*6/uL (ref 3.70–5.45)
RDW: 12.5 % (ref 11.2–14.5)
WBC: 7.1 10*3/uL (ref 3.9–10.3)

## 2013-03-20 MED ORDER — TAMOXIFEN CITRATE 20 MG PO TABS: 20.0000 mg | ORAL_TABLET | Freq: Every day | ORAL | Status: AC

## 2013-03-20 NOTE — Progress Notes (Signed)
OFFICE PROGRESS NOTE  CC  [name], MD Margaret Reynolds [id number] Dr. Excell Seltzer Dr. Arloa Koh  DIAGNOSIS: 53 year old female with  diagnosis of left breast cancer.   Breast cancer of upper-outer quadrant of left female breast   Primary site: Breast (Left)   Staging method: AJCC 7th Edition   Clinical: Stage IA (T1c, N0, cM0)   Summary: Stage IA (T1c, N0, cM0)  PRIOR THERAPY: Margaret Reynolds is a 53 y.o. female. With   #1 family history significant for mother with breast cancer at the age of 33. Patient has a history of dense breast she's been having high-risk mammography performed as well as MRIs. Recently she had an MRI of the breasts performed in the left breast at the 2:00 position there was noted to be a 1.9 cm mass nodes were within the left axilla were normal. She had an ultrasound performed and this confirmed at the 2:00 position a 1.2 cm area of concern. She underwent a biopsy. This showed a invasive ductal carcinoma grade 1 low grade node was negative. There was associated DCIS. Tumor was ER positive PR positive HER-2/neu negative with a proliferation marker Ki-67 of only 10%.   #2  status post lumpectomy of the left breast with the final pathology revealing 1.9 cm grade 1 invasive ductal carcinoma ER positive PR positive sentinel nodes were negative for metastatic disease. Patient had Oncotype DX testing performed that revealed a breast cancer recurrence score of 9 giving her a 7% risk of distant recurrence with antiestrogen therapy alone. This is in the low risk category. Patient and I discussed this today.  #3 s/p radiation therapy administered in [place] by Dr. Alroy Dust. Completed in January 2015.  #4 begin anti-estrogen therapy with tamoxifen 20 mg daily. Risks and benefits of therapy explained to the patient in detail. She has demonstrated understanding. Total of 5 - 10 years of therapy planned    CURRENT THERAPY: tamoxifen 20 mg by  beginning 03/20/13  INTERVAL HISTORY: Margaret Reynolds 53 y.o. female returns for follow up visit. She has now completed radiation therapy. It was given to her in [place], [place]. She tells me she did very well with it except for some erythema. Today she is without any major complaints. She denies any fevers chills or She has no nausea or v no abdominal She denies having any myalgias and arthralgias. She has bee Remainder of the review of systems is negative.  MEDICAL HISTORY: Past Medical History  Diagnosis Date  . Breast cancer   . Hypertension   . Arthritis   . Contact lens/glasses fitting     wears contacts or glasses    ALLERGIES:  has No Known Allergies.  MEDICATIONS:  Current Outpatient Prescriptions  Medication Sig Dispense Refill  . Calcium Citrate-Vitamin D (CITRACAL + D PO) Take by mouth.      . enalapril (VASOTEC) 5 MG tablet Take 5 mg by mouth daily.       No current facility-administered medications for this visit.    SURGICAL HISTORY:  Past Surgical History  Procedure Laterality Date  . Breast lumpectomy  3532,9924    lt br,rt   . Minor fulgeration of anal condyloma  2007  . Cesarean section    . Bunionectomy  2013    right foot  . Colonoscopy    . Breast lumpectomy with needle localization and axillary sentinel lymph node bx Left 11/19/2012    Procedure: BREAST LUMPECTOMY WITH NEEDLE LOCALIZATION AND  AXILLARY SENTINEL LYMPH NODE BIOPSY;  Surgeon: Haywood Lasso, MD;  Location: [place];  Service: General;  Laterality: Left;    REVIEW OF SYSTEMS:  Pertinent items are noted in HPI.     PHYSICAL EXAMINATION: Blood pressure 141/89, pulse 64, temperature 97.7 F (36.5 C), temperature source Oral, resp. rate 20, height 5' 4" (1.626 m), weight 158 lb 6.4 oz (71.85 kg). Body mass index is 27.18 kg/(m^2). ECOG PERFORMANCE STATUS: 1 - Symptomatic but completely ambulatory   General appearance: alert, cooperative and appears stated age Lymph nodes:  Cervical, supraclavicular, and axillary nodes normal. Resp: clear to auscultation bilaterally Back: symmetric, no curvature. ROM normal. No CVA tenderness. Cardio: regular rate and rhythm GI: soft, non-tender; bowel sounds normal; no masses,  no organomegaly Extremities: extremities normal, atraumatic, no cyanosis or edema Neurologic: Grossly normal Breasts: breasts appear normal, no suspicious masses, no skin or nipple changes or axillary nodes.   LABORATORY DATA: Lab Results  Component Value Date   WBC 7.1 03/20/2013   HGB 14.5 03/20/2013   HCT 42.4 03/20/2013   MCV 88.9 03/20/2013   PLT 235 03/20/2013      Chemistry      Component Value Date/Time   NA 142 03/20/2013 1105   K 4.6 03/20/2013 1105   CO2 27 03/20/2013 1105   BUN 13.4 03/20/2013 1105   CREATININE 0.8 03/20/2013 1105      Component Value Date/Time   CALCIUM 10.7* 03/20/2013 1105   ALKPHOS 52 03/20/2013 1105   AST 16 03/20/2013 1105   ALT 19 03/20/2013 1105   BILITOT 0.63 03/20/2013 1105       RADIOGRAPHIC STUDIES:  No results found.  ASSESSMENT/PLAN: 53 year old female with   1. Stage I (T1N0) IDC s/p lumpectomy and radiation therapy. Tumor was ER+ she is recommended adjuvant anti-estrogen therapy with tamoxifen 20 mg daily. Risks and benefits of treatement discussed with the patient today. She was given a prescription for tamoxifen.  2. She will be seen back in 3 months for follow up to evaluate for any side effects.  3. She knows to call me with any problems.   All questions were answered. The patient knows to call the clinic with any problems, questions or concerns. We can certainly see the patient much sooner if necessary.  I spent 15 minutes counseling the patient face to face. The total time spent in the appointment was 25 minutes.    Marcy Panning, MD Medical/Oncology [facility] [phone] (beeper) [phone] (Office)  03/20/2013, 11:56 AM

## 2013-03-20 NOTE — Patient Instructions (Signed)

## 2013-04-23 ENCOUNTER — Ambulatory Visit (INDEPENDENT_AMBULATORY_CARE_PROVIDER_SITE_OTHER): Payer: BC Managed Care – PPO | Admitting: General Surgery

## 2013-04-23 ENCOUNTER — Encounter (INDEPENDENT_AMBULATORY_CARE_PROVIDER_SITE_OTHER): Payer: Self-pay | Admitting: General Surgery

## 2013-04-23 VITALS — BP 120/80 | HR 80 | Temp 97.8°F | Resp 14 | Ht 64.0 in | Wt 154.8 lb

## 2013-04-23 DIAGNOSIS — Z09 Encounter for follow-up examination after completed treatment for conditions other than malignant neoplasm: Secondary | ICD-10-CM

## 2013-04-23 NOTE — Progress Notes (Signed)
History: Patient returns for followup for her persistent left axillary seroma status post lumpectomy and left axillary sentinel lymph node biopsy in October. She reports that the area is feeling much better with just minimal swelling.  Exam: BP 120/80  Pulse 80  Temp(Src) 97.8 F (36.6 C) (Temporal)  Resp 14  Ht 5\' 4"  (1.626 m)  Wt 154 lb 12.8 oz (70.217 kg)  BMI 26.56 kg/m2 General: Appears well Left axilla shows a well-healed wound was now just some minimal scarring or induration and no evidence of persistent seroma.  Assessment and plan: Doing well postoperatively. I recommended that we see her back in about 6 months for oncologic followup

## 2013-06-16 ENCOUNTER — Telehealth: Payer: Self-pay | Admitting: Adult Health

## 2013-06-17 ENCOUNTER — Ambulatory Visit (INDEPENDENT_AMBULATORY_CARE_PROVIDER_SITE_OTHER): Payer: BC Managed Care – PPO | Admitting: General Surgery

## 2013-06-17 ENCOUNTER — Encounter (INDEPENDENT_AMBULATORY_CARE_PROVIDER_SITE_OTHER): Payer: Self-pay | Admitting: General Surgery

## 2013-06-17 VITALS — BP 126/82 | HR 78 | Temp 97.5°F | Resp 12 | Ht 64.0 in | Wt 151.0 lb

## 2013-06-17 DIAGNOSIS — I890 Lymphedema, not elsewhere classified: Secondary | ICD-10-CM

## 2013-06-17 DIAGNOSIS — C50412 Malignant neoplasm of upper-outer quadrant of left female breast: Secondary | ICD-10-CM

## 2013-06-17 DIAGNOSIS — C50419 Malignant neoplasm of upper-outer quadrant of unspecified female breast: Secondary | ICD-10-CM

## 2013-06-17 NOTE — Progress Notes (Signed)
Chief complaint: Left arm swelling and discomfort  History: Patient returns to the office with history of stage IA invasive left breast cancer status post lumpectomy and sentinel lymph node biopsy which was complicated by a postoperative seroma that resolved after aspiration to. She subsequently had radiation and on tamoxifen. She comes in due to some mild discomfort that occurs down the inside of her left arm and down toward her forearm. She also notices some puffiness of her upper arm above the elbow. No axillary or breast symptoms.  Exam: BP 126/82  Pulse 78  Temp(Src) 97.5 F (36.4 C) (Temporal)  Resp 12  Ht 5\' 4"  (1.626 m)  Wt 151 lb (68.493 kg)  BMI 25.91 kg/m2 General: Appears well Lymph nodes: No cervical, subclavicular or axillary nodes palpable Breasts: No palpable mass with particular attention to the lumpectomy site Extremities: I cannot detect any apparent swelling. On measurement there is some slight discrepancy with upper arm circumference 28 cm on the left and 27 cm on the right.  Assessment and plan: Possible mild lymphedema post surgery and radiation as above. I'm going to refer her to the lymphedema clinic for education and physical therapy. She is to return to see me in 6 months for long-term cancer followup.

## 2013-06-25 ENCOUNTER — Ambulatory Visit: Payer: BC Managed Care – PPO | Admitting: Oncology

## 2013-06-25 ENCOUNTER — Other Ambulatory Visit: Payer: BC Managed Care – PPO

## 2013-07-01 ENCOUNTER — Ambulatory Visit: Payer: BC Managed Care – PPO | Attending: General Surgery | Admitting: Physical Therapy

## 2013-07-01 DIAGNOSIS — I890 Lymphedema, not elsewhere classified: Secondary | ICD-10-CM | POA: Insufficient documentation

## 2013-07-01 DIAGNOSIS — IMO0001 Reserved for inherently not codable concepts without codable children: Secondary | ICD-10-CM | POA: Insufficient documentation

## 2013-07-09 ENCOUNTER — Other Ambulatory Visit (HOSPITAL_BASED_OUTPATIENT_CLINIC_OR_DEPARTMENT_OTHER): Payer: BC Managed Care – PPO

## 2013-07-09 ENCOUNTER — Encounter: Payer: Self-pay | Admitting: Adult Health

## 2013-07-09 ENCOUNTER — Ambulatory Visit (HOSPITAL_BASED_OUTPATIENT_CLINIC_OR_DEPARTMENT_OTHER): Payer: BC Managed Care – PPO | Admitting: Adult Health

## 2013-07-09 VITALS — BP 153/87 | HR 68 | Temp 98.0°F | Resp 18 | Ht 64.0 in | Wt 150.2 lb

## 2013-07-09 DIAGNOSIS — I890 Lymphedema, not elsewhere classified: Secondary | ICD-10-CM

## 2013-07-09 DIAGNOSIS — C50419 Malignant neoplasm of upper-outer quadrant of unspecified female breast: Secondary | ICD-10-CM

## 2013-07-09 DIAGNOSIS — C50412 Malignant neoplasm of upper-outer quadrant of left female breast: Secondary | ICD-10-CM

## 2013-07-09 DIAGNOSIS — Z79810 Long term (current) use of selective estrogen receptor modulators (SERMs): Secondary | ICD-10-CM

## 2013-07-09 DIAGNOSIS — Z170 Estrogen receptor positive status [ER+]: Secondary | ICD-10-CM

## 2013-07-09 LAB — CBC WITH DIFFERENTIAL/PLATELET
BASO%: 0.4 % (ref 0.0–2.0)
BASOS ABS: 0 10*3/uL (ref 0.0–0.1)
EOS ABS: 0.1 10*3/uL (ref 0.0–0.5)
EOS%: 1 % (ref 0.0–7.0)
HCT: 39.7 % (ref 34.8–46.6)
HGB: 13.7 g/dL (ref 11.6–15.9)
LYMPH%: 32.1 % (ref 14.0–49.7)
MCH: 30.7 pg (ref 25.1–34.0)
MCHC: 34.5 g/dL (ref 31.5–36.0)
MCV: 89 fL (ref 79.5–101.0)
MONO#: 0.3 10*3/uL (ref 0.1–0.9)
MONO%: 5 % (ref 0.0–14.0)
NEUT%: 61.5 % (ref 38.4–76.8)
NEUTROS ABS: 3.2 10*3/uL (ref 1.5–6.5)
Platelets: 237 10*3/uL (ref 145–400)
RBC: 4.46 10*6/uL (ref 3.70–5.45)
RDW: 12.9 % (ref 11.2–14.5)
WBC: 5.2 10*3/uL (ref 3.9–10.3)
lymph#: 1.7 10*3/uL (ref 0.9–3.3)

## 2013-07-09 LAB — COMPREHENSIVE METABOLIC PANEL (CC13)
ALBUMIN: 4 g/dL (ref 3.5–5.0)
ALT: 24 U/L (ref 0–55)
ANION GAP: 9 meq/L (ref 3–11)
AST: 18 U/L (ref 5–34)
Alkaline Phosphatase: 42 U/L (ref 40–150)
BUN: 9.1 mg/dL (ref 7.0–26.0)
CHLORIDE: 105 meq/L (ref 98–109)
CO2: 27 meq/L (ref 22–29)
Calcium: 10.1 mg/dL (ref 8.4–10.4)
Creatinine: 0.8 mg/dL (ref 0.6–1.1)
GLUCOSE: 90 mg/dL (ref 70–140)
POTASSIUM: 4 meq/L (ref 3.5–5.1)
SODIUM: 142 meq/L (ref 136–145)
TOTAL PROTEIN: 6.8 g/dL (ref 6.4–8.3)
Total Bilirubin: 0.54 mg/dL (ref 0.20–1.20)

## 2013-07-09 NOTE — Progress Notes (Signed)
OFFICE PROGRESS NOTE  CC  [name], MD [place] [id number] Dr. Excell Seltzer Dr. Arloa Koh  DIAGNOSIS: 53 year old female with  diagnosis of left breast cancer.   Breast cancer of upper-outer quadrant of left female breast   Primary site: Breast (Left)   Staging method: AJCC 7th Edition   Clinical: Stage IA (T1c, N0, cM0)   Summary: Stage IA (T1c, N0, cM0)  PRIOR THERAPY: Margaret Reynolds is a 53 y.o. female. With   #1 family history significant for mother with breast cancer at the age of 65. Patient has a history of dense breast she's been having high-risk mammography performed as well as MRIs. Recently she had an MRI of the breasts performed in the left breast at the 2:00 position there was noted to be a 1.9 cm mass nodes were within the left axilla were normal. She had an ultrasound performed and this confirmed at the 2:00 position a 1.2 cm area of concern. She underwent a biopsy. This showed a invasive ductal carcinoma grade 1 low grade node was negative. There was associated DCIS. Tumor was ER positive PR positive HER-2/neu negative with a proliferation marker Ki-67 of only 10%.   #2  status post lumpectomy of the left breast with the final pathology revealing 1.9 cm grade 1 invasive ductal carcinoma ER positive PR positive sentinel nodes were negative for metastatic disease. Patient had Oncotype DX testing performed that revealed a breast cancer recurrence score of 9 giving her a 7% risk of distant recurrence with antiestrogen therapy alone. This is in the low risk category. Patient and I discussed this today.  #3 s/p radiation therapy administered in [place] by Dr. Alroy Dust. Completed in January 2015.  #4 begin anti-estrogen therapy with tamoxifen 20 mg daily. Risks and benefits of therapy explained to the patient in detail. She has demonstrated understanding. Total of 5 - 10 years of therapy planned    CURRENT THERAPY: tamoxifen 20 mg by  beginning 03/20/13  INTERVAL HISTORY: Margaret Reynolds 53 y.o. female returns for follow up visit. She is doing well today.  She is taking Tamoxifen daily.  She is having hot flahses occasionally, and mild mood changes.  She otherwise denies fevers, chills, nausea, vomiting, constipation, diarrhea, numbness.  She does have lymphedema in her left arm.  She just got a sleeve and is adjusting to wearing it.  She tells me that Dr. Humphrey Rolls discussed starting a "shot" with her at this appointment so she won't have to take Tamoxifen for 10 years and was going to check her hormones at some point to see if she is in menopause. We reviewed her health maintenance below.    MEDICAL HISTORY: Past Medical History  Diagnosis Date  . Breast cancer   . Hypertension   . Arthritis   . Contact lens/glasses fitting     wears contacts or glasses    ALLERGIES:  has No Known Allergies.  MEDICATIONS:  Current Outpatient Prescriptions  Medication Sig Dispense Refill  . Calcium Citrate-Vitamin D (CITRACAL + D PO) Take by mouth.      . enalapril (VASOTEC) 5 MG tablet Take 5 mg by mouth daily.      . tamoxifen (NOLVADEX) 20 MG tablet        No current facility-administered medications for this visit.    SURGICAL HISTORY:  Past Surgical History  Procedure Laterality Date  . Breast lumpectomy  4982,6415    lt br,rt   . Minor fulgeration  of anal condyloma  2007  . Cesarean section    . Bunionectomy  2013    right foot  . Colonoscopy    . Breast lumpectomy with needle localization and axillary sentinel lymph node bx Left 11/19/2012    Procedure: BREAST LUMPECTOMY WITH NEEDLE LOCALIZATION AND AXILLARY SENTINEL LYMPH NODE BIOPSY;  Surgeon: Haywood Lasso, MD;  Location: [facility];  Service: General;  Laterality: Left;    REVIEW OF SYSTEMS:  A 10 point review of systems was conducted and is otherwise negative except for what is noted above.    Health Maintenance  Mammogram: 09/2012 Colonoscopy:  2013 Bone Density Scan: 2013 normal Pap Smear: 2014 Eye Exam: 10/2012 Vitamin D Level: never checked Lipid Panel: 2014     PHYSICAL EXAMINATION: Blood pressure 153/87, pulse 68, temperature 98 F (36.7 C), temperature source Oral, resp. rate 18, height 5' 4" (1.626 m), weight 150 lb 3.2 oz (68.13 kg). Body mass index is 25.77 kg/(m^2). GENERAL: Patient is a well appearing female in no acute distress HEENT:  Sclerae anicteric.  Oropharynx clear and moist. No ulcerations or evidence of oropharyngeal candidiasis. Neck is supple.  NODES:  No cervical, supraclavicular, or axillary lymphadenopathy palpated.  BREAST EXAM:  Left breast lumpectomy without nodularity, no masses, no nodules, right breast no masses or nodules, benign bilateral breast exam.   LUNGS:  Clear to auscultation bilaterally.  No wheezes or rhonchi. HEART:  Regular rate and rhythm. No murmur appreciated. ABDOMEN:  Soft, nontender.  Positive, normoactive bowel sounds. No organomegaly palpated. MSK:  No focal spinal tenderness to palpation. Full range of motion bilaterally in the upper extremities. EXTREMITIES:  No peripheral edema.   SKIN:  Clear with no obvious rashes or skin changes. No nail dyscrasia. NEURO:  Nonfocal. Well oriented.  Appropriate affect. ECOG PERFORMANCE STATUS: 1 - Symptomatic but completely ambulatory       LABORATORY DATA: Lab Results  Component Value Date   WBC 5.2 07/09/2013   HGB 13.7 07/09/2013   HCT 39.7 07/09/2013   MCV 89.0 07/09/2013   PLT 237 07/09/2013      Chemistry      Component Value Date/Time   NA 142 07/09/2013 1100   K 4.0 07/09/2013 1100   CO2 27 07/09/2013 1100   BUN 9.1 07/09/2013 1100   CREATININE 0.8 07/09/2013 1100      Component Value Date/Time   CALCIUM 10.1 07/09/2013 1100   ALKPHOS 42 07/09/2013 1100   AST 18 07/09/2013 1100   ALT 24 07/09/2013 1100   BILITOT 0.54 07/09/2013 1100       RADIOGRAPHIC STUDIES:  No results found.  ASSESSMENT/PLAN: 53 year old  female with   1. Stage I (T1N0) IDC s/p lumpectomy and radiation therapy. Tumor was ER+ she was recommended adjuvant anti-estrogen therapy with tamoxifen 20 mg daily. We discussed the "shot" discussed with Dr. Humphrey Rolls.  I think she was referring to Zoladex, however I cannot find any documentation about this in her last note.  She did have an estradiol level and FSH drawn today.  We will await these results and f/u with her about a final plan.  She will continue on daily Tamoxifen.    2.  I will f/u with Dr. Jana Hakim about the above and call her if there is a change in the plan.    The patient will return in 6 months for labs and evaluation.    All questions were answered. The patient knows to call the clinic  with any problems, questions or concerns. We can certainly see the patient much sooner if necessary.  I spent 25 minutes counseling the patient face to face. The total time spent in the appointment was 30 minutes.   Minette Headland, [place] [phone] 07/10/2013, 11:46 AM

## 2013-07-09 NOTE — Patient Instructions (Addendum)
You are doing well.  You have no sign of recurrence.  Continue taking Tamoxifen daily.  I recommend healthy diet, exercise, and monthly breast exams.  Please call us if you have any questions or concerns.      Goserelin injection What is this medicine? GOSERELIN (GOE se rel in) is similar to a hormone found in the body. It lowers the amount of sex hormones that the body makes. Men will have lower testosterone levels and women will have lower estrogen levels while taking this medicine. In men, this medicine is used to treat prostate cancer; the injection is either given once per month or once every 12 weeks. A once per month injection (only) is used to treat women with endometriosis, dysfunctional uterine bleeding, or advanced breast cancer. This medicine may be used for other purposes; ask your health care provider or pharmacist if you have questions. COMMON BRAND NAME(S): Zoladex What should I tell my health care provider before I take this medicine? They need to know if you have any of these conditions (some only apply to women): -diabetes -heart disease or previous heart attack -high blood pressure -high cholesterol -kidney disease -osteoporosis or low bone density -problems passing urine -spinal cord injury -stroke -tobacco smoker -an unusual or allergic reaction to goserelin, hormone therapy, other medicines, foods, dyes, or preservatives -pregnant or trying to get pregnant -breast-feeding How should I use this medicine? This medicine is for injection under the skin. It is given by a health care professional in a hospital or clinic setting. Men receive this injection once every 4 weeks or once every 12 weeks. Women will only receive the once every 4 weeks injection. Talk to your pediatrician regarding the use of this medicine in children. Special care may be needed. Overdosage: If you think you have taken too much of this medicine contact a poison control center or emergency room at  once. NOTE: This medicine is only for you. Do not share this medicine with others. What if I miss a dose? It is important not to miss your dose. Call your doctor or health care professional if you are unable to keep an appointment. What may interact with this medicine? -female hormones like estrogen -herbal or dietary supplements like black cohosh, chasteberry, or DHEA -female hormones like testosterone -prasterone This list may not describe all possible interactions. Give your health care provider a list of all the medicines, herbs, non-prescription drugs, or dietary supplements you use. Also tell them if you smoke, drink alcohol, or use illegal drugs. Some items may interact with your medicine. What should I watch for while using this medicine? Visit your doctor or health care professional for regular checks on your progress. Your symptoms may appear to get worse during the first weeks of this therapy. Tell your doctor or healthcare professional if your symptoms do not start to get better or if they get worse after this time. Your bones may get weaker if you take this medicine for a long time. If you smoke or frequently drink alcohol you may increase your risk of bone loss. A family history of osteoporosis, chronic use of drugs for seizures (convulsions), or corticosteroids can also increase your risk of bone loss. Talk to your doctor about how to keep your bones strong. This medicine should stop regular monthly menstration in women. Tell your doctor if you continue to [facility]. Women should not become pregnant while taking this medicine or for 12 weeks after stopping this medicine. Women should inform their doctor  if they wish to become pregnant or think they might be pregnant. There is a potential for serious side effects to an unborn child. Talk to your health care professional or pharmacist for more information. Do not breast-feed an infant while taking this medicine. Men should inform their  doctors if they wish to father a child. This medicine may lower sperm counts. Talk to your health care professional or pharmacist for more information. What side effects may I notice from receiving this medicine? Side effects that you should report to your doctor or health care professional as soon as possible: -allergic reactions like skin rash, itching or hives, swelling of the face, lips, or tongue -bone pain -breathing problems -changes in vision -chest pain -feeling faint or lightheaded, falls -fever, chills -pain, swelling, warmth in the leg -pain, tingling, numbness in the hands or feet -swelling of the ankles, feet, hands -trouble passing urine or change in the amount of urine -unusually high or low blood pressure -unusually weak or tired Side effects that usually do not require medical attention (report to your doctor or health care professional if they continue or are bothersome): -change in sex drive or performance -changes in breast size in both males and females -changes in emotions or moods -headache -hot flashes -irritation at site where injected -loss of appetite -skin problems like acne, dry skin -vaginal dryness This list may not describe all possible side effects. Call your doctor for medical advice about side effects. You may report side effects to FDA at 1-800-FDA-1088. Where should I keep my medicine? This drug is given in a hospital or clinic and will not be stored at home. NOTE: This sheet is a summary. It may not cover all possible information. If you have questions about this medicine, talk to your doctor, pharmacist, or health care provider.  2014, Elsevier/Gold Standard. (2008-06-02 13:28:29)  Breast Self-Awareness Practicing breast self-awareness may pick up problems early, prevent significant medical complications, and possibly save your life. By practicing breast self-awareness, you can become familiar with how your breasts look and feel and if your  breasts are changing. This allows you to notice changes early. It can also offer you some reassurance that your breast health is good. One way to learn what is normal for your breasts and whether your breasts are changing is to do a breast self-exam. If you find a lump or something that was not present in the past, it is best to contact your caregiver right away. Other findings that should be evaluated by your caregiver include nipple discharge, especially if it is bloody; skin changes or reddening; areas where the skin seems to be pulled in (retracted); or new lumps and bumps. Breast pain is seldom associated with cancer (malignancy), but should also be evaluated by a caregiver. HOW TO PERFORM A BREAST SELF-EXAM The best time to examine your breasts is 5 7 days after your menstrual period is over. During menstruation, the breasts are lumpier, and it may be more difficult to pick up changes. If you do not menstruate, have reached menopause, or had your uterus removed (hysterectomy), you should examine your breasts at regular intervals, such as monthly. If you are breastfeeding, examine your breasts after a feeding or after using a breast pump. Breast implants do not decrease the risk for lumps or tumors, so continue to perform breast self-exams as recommended. Talk to your caregiver about how to determine the difference between the implant and breast tissue. Also, talk about the amount of pressure you  should use during the exam. Over time, you will become more familiar with the variations of your breasts and more comfortable with the exam. A breast self-exam requires you to remove all your clothes above the waist. 1. Look at your breasts and nipples. Stand in front of a mirror in a room with good lighting. With your hands on your hips, push your hands firmly downward. Look for a difference in shape, contour, and size from one breast to the other (asymmetry). Asymmetry includes puckers, dips, or bumps. Also, look  for skin changes, such as reddened or scaly areas on the breasts. Look for nipple changes, such as discharge, dimpling, repositioning, or redness. 2. Carefully feel your breasts. This is best done either in the shower or tub while using soapy water or when flat on your back. Place the arm (on the side of the breast you are examining) above your head. Use the pads (not the fingertips) of your three middle fingers on your opposite hand to feel your breasts. Start in the underarm area and use  inch (2 cm) overlapping circles to feel your breast. Use 3 different levels of pressure (light, medium, and firm pressure) at each circle before moving to the next circle. The light pressure is needed to feel the tissue closest to the skin. The medium pressure will help to feel breast tissue a little deeper, while the firm pressure is needed to feel the tissue close to the ribs. Continue the overlapping circles, moving downward over the breast until you feel your ribs below your breast. Then, move one finger-width towards the center of the body. Continue to use the  inch (2 cm) overlapping circles to feel your breast as you move slowly up toward the collar bone (clavicle) near the base of the neck. Continue the up and down exam using all 3 pressures until you reach the middle of the chest. Do this with each breast, carefully feeling for lumps or changes. 3.  Keep a written record with breast changes or normal findings for each breast. By writing this information down, you do not need to depend only on memory for size, tenderness, or location. Write down where you are in your menstrual cycle, if you are still menstruating. Breast tissue can have some lumps or thick tissue. However, see your caregiver if you find anything that concerns you.  SEEK MEDICAL CARE IF:  You see a change in shape, contour, or size of your breasts or nipples.   You see skin changes, such as reddened or scaly areas on the breasts or nipples.    You have an unusual discharge from your nipples.   You feel a new lump or unusually thick areas.  Document Released: 01/16/2005 Document Revised: 01/03/2012 Document Reviewed: 05/03/2011 [facility] Patient Information 2014 [place].

## 2013-07-10 ENCOUNTER — Other Ambulatory Visit: Payer: Self-pay | Admitting: Gynecology

## 2013-07-10 DIAGNOSIS — Z853 Personal history of malignant neoplasm of breast: Secondary | ICD-10-CM

## 2013-07-10 DIAGNOSIS — Z9889 Other specified postprocedural states: Secondary | ICD-10-CM

## 2013-07-10 LAB — FOLLICLE STIMULATING HORMONE: FSH: 22.8 m[IU]/mL

## 2013-07-12 ENCOUNTER — Telehealth: Payer: Self-pay | Admitting: Oncology

## 2013-07-15 LAB — ESTRADIOL, ULTRA SENS: ESTRADIOL, ULTRA SENSITIVE: 41 pg/mL

## 2013-08-13 ENCOUNTER — Encounter: Payer: Self-pay | Admitting: Adult Health

## 2013-08-14 ENCOUNTER — Telehealth: Payer: Self-pay | Admitting: Oncology

## 2013-08-14 ENCOUNTER — Other Ambulatory Visit: Payer: Self-pay | Admitting: Adult Health

## 2013-08-14 NOTE — Progress Notes (Signed)
Called patient regarding her recent email to the nurses.  Patient can take Zoladex every 28 days and we will switch her to Arimidex.  I requested that the patient return my phone call so I could discuss with her in more detail, or schedule an appointment to talk to her about this change.    Margaret Reynolds, [place] [phone]

## 2013-08-14 NOTE — Telephone Encounter (Signed)
pt called to r/s her dec appts to 12/21. per pt she needs appts for both her and her mom who is also a pt of [facility] on the same day and labs for both of them needs to be drawn on day of f/u as she canntot make 2 trips. pt given new appt for 12/21 for her and her mom.

## 2013-08-14 NOTE — Telephone Encounter (Signed)
Message printed and to Charlestine Massed NP for review.

## 2013-08-15 ENCOUNTER — Telehealth: Payer: Self-pay | Admitting: Adult Health

## 2013-08-15 ENCOUNTER — Other Ambulatory Visit: Payer: Self-pay | Admitting: Adult Health

## 2013-08-15 DIAGNOSIS — C50412 Malignant neoplasm of upper-outer quadrant of left female breast: Secondary | ICD-10-CM

## 2013-08-15 MED ORDER — ANASTROZOLE 1 MG PO TABS: 1.0000 mg | ORAL_TABLET | Freq: Every day | ORAL | Status: DC

## 2013-08-15 NOTE — Progress Notes (Signed)
Called patient about stopping Tamoxifen and switching to Arimidex.    I ordered Zoladex and she will receive this beginning 08/22/13 and get it monthly.  She will stop Tamoxifen today and start Arimidex in one month.  We will see her back ina round 3 months to evaluate how well she is tolerating this different regimen.  Patient verbalized understanding.    Minette Headland, [place] [phone]'

## 2013-08-15 NOTE — Telephone Encounter (Signed)
cld & spoke to pt to adv of inj sch-gave time &date-pt understood-adv pt I will mail copy of sch

## 2013-08-18 NOTE — Telephone Encounter (Signed)
Noted orders and appointments for Zoladex injections for this patient.

## 2013-08-22 ENCOUNTER — Ambulatory Visit (HOSPITAL_BASED_OUTPATIENT_CLINIC_OR_DEPARTMENT_OTHER): Payer: BC Managed Care – PPO

## 2013-08-22 VITALS — BP 124/67 | HR 82 | Temp 98.4°F

## 2013-08-22 DIAGNOSIS — C50412 Malignant neoplasm of upper-outer quadrant of left female breast: Secondary | ICD-10-CM

## 2013-08-22 DIAGNOSIS — Z5111 Encounter for antineoplastic chemotherapy: Secondary | ICD-10-CM

## 2013-08-22 DIAGNOSIS — C50419 Malignant neoplasm of upper-outer quadrant of unspecified female breast: Secondary | ICD-10-CM

## 2013-08-22 MED ORDER — GOSERELIN ACETATE 3.6 MG ~~LOC~~ IMPL
3.6000 mg | DRUG_IMPLANT | Freq: Once | SUBCUTANEOUS | Status: AC
  Administered 2013-08-22: 3.6 mg via SUBCUTANEOUS
  Filled 2013-08-22: qty 3.6

## 2013-08-22 NOTE — Patient Instructions (Signed)
Goserelin injection What is this medicine? GOSERELIN (GOE se rel in) is similar to a hormone found in the body. It lowers the amount of sex hormones that the body makes. Men will have lower testosterone levels and women will have lower estrogen levels while taking this medicine. In men, this medicine is used to treat prostate cancer; the injection is either given once per month or once every 12 weeks. A once per month injection (only) is used to treat women with endometriosis, dysfunctional uterine bleeding, or advanced breast cancer. This medicine may be used for other purposes; ask your health care provider or pharmacist if you have questions. COMMON BRAND NAME(S): Zoladex What should I tell my health care provider before I take this medicine? They need to know if you have any of these conditions (some only apply to women): -diabetes -heart disease or previous heart attack -high blood pressure -high cholesterol -kidney disease -osteoporosis or low bone density -problems passing urine -spinal cord injury -stroke -tobacco smoker -an unusual or allergic reaction to goserelin, hormone therapy, other medicines, foods, dyes, or preservatives -pregnant or trying to get pregnant -breast-feeding How should I use this medicine? This medicine is for injection under the skin. It is given by a health care professional in a hospital or clinic setting. Men receive this injection once every 4 weeks or once every 12 weeks. Women will only receive the once every 4 weeks injection. Talk to your pediatrician regarding the use of this medicine in children. Special care may be needed. Overdosage: If you think you have taken too much of this medicine contact a poison control center or emergency room at once. NOTE: This medicine is only for you. Do not share this medicine with others. What if I miss a dose? It is important not to miss your dose. Call your doctor or health care professional if you are unable to  keep an appointment. What may interact with this medicine? -female hormones like estrogen -herbal or dietary supplements like black cohosh, chasteberry, or DHEA -female hormones like testosterone -prasterone This list may not describe all possible interactions. Give your health care provider a list of all the medicines, herbs, non-prescription drugs, or dietary supplements you use. Also tell them if you smoke, drink alcohol, or use illegal drugs. Some items may interact with your medicine. What should I watch for while using this medicine? Visit your doctor or health care professional for regular checks on your progress. Your symptoms may appear to get worse during the first weeks of this therapy. Tell your doctor or healthcare professional if your symptoms do not start to get better or if they get worse after this time. Your bones may get weaker if you take this medicine for a long time. If you smoke or frequently drink alcohol you may increase your risk of bone loss. A family history of osteoporosis, chronic use of drugs for seizures (convulsions), or corticosteroids can also increase your risk of bone loss. Talk to your doctor about how to keep your bones strong. This medicine should stop regular monthly menstration in women. Tell your doctor if you continue to menstrate. Women should not become pregnant while taking this medicine or for 12 weeks after stopping this medicine. Women should inform their doctor if they wish to become pregnant or think they might be pregnant. There is a potential for serious side effects to an unborn child. Talk to your health care professional or pharmacist for more information. Do not breast-feed an infant while taking   this medicine. Men should inform their doctors if they wish to father a child. This medicine may lower sperm counts. Talk to your health care professional or pharmacist for more information. What side effects may I notice from receiving this  medicine? Side effects that you should report to your doctor or health care professional as soon as possible: -allergic reactions like skin rash, itching or hives, swelling of the face, lips, or tongue -bone pain -breathing problems -changes in vision -chest pain -feeling faint or lightheaded, falls -fever, chills -pain, swelling, warmth in the leg -pain, tingling, numbness in the hands or feet -signs and symptoms of low blood pressure like dizziness; feeling faint or lightheaded, falls; unusually weak or tired -stomach pain -swelling of the ankles, feet, hands -trouble passing urine or change in the amount of urine -unusually high or low blood pressure -unusually weak or tired Side effects that usually do not require medical attention (report to your doctor or health care professional if they continue or are bothersome): -change in sex drive or performance -changes in breast size in both males and females -changes in emotions or moods -headache -hot flashes -irritation at site where injected -loss of appetite -skin problems like acne, dry skin -vaginal dryness This list may not describe all possible side effects. Call your doctor for medical advice about side effects. You may report side effects to FDA at 1-800-FDA-1088. Where should I keep my medicine? This drug is given in a hospital or clinic and will not be stored at home. NOTE: This sheet is a summary. It may not cover all possible information. If you have questions about this medicine, talk to your doctor, pharmacist, or health care provider.  2015, Elsevier/Gold Standard. (2013-03-25 11:10:35)  

## 2013-09-03 ENCOUNTER — Other Ambulatory Visit: Payer: Self-pay | Admitting: *Deleted

## 2013-09-03 ENCOUNTER — Encounter: Payer: Self-pay | Admitting: Adult Health

## 2013-09-03 MED ORDER — VENLAFAXINE HCL ER 37.5 MG PO TB24: 37.5000 mg | ORAL_TABLET | Freq: Every day | ORAL | Status: DC

## 2013-09-04 ENCOUNTER — Encounter: Payer: Self-pay | Admitting: Adult Health

## 2013-09-05 ENCOUNTER — Encounter: Payer: Self-pay | Admitting: Adult Health

## 2013-09-18 ENCOUNTER — Telehealth: Payer: Self-pay | Admitting: Adult Health

## 2013-09-18 ENCOUNTER — Ambulatory Visit (HOSPITAL_BASED_OUTPATIENT_CLINIC_OR_DEPARTMENT_OTHER): Payer: BC Managed Care – PPO

## 2013-09-18 VITALS — BP 130/75 | HR 82 | Temp 98.1°F

## 2013-09-18 DIAGNOSIS — C50412 Malignant neoplasm of upper-outer quadrant of left female breast: Secondary | ICD-10-CM

## 2013-09-18 DIAGNOSIS — C50419 Malignant neoplasm of upper-outer quadrant of unspecified female breast: Secondary | ICD-10-CM

## 2013-09-18 DIAGNOSIS — Z5111 Encounter for antineoplastic chemotherapy: Secondary | ICD-10-CM

## 2013-09-18 MED ORDER — GOSERELIN ACETATE 3.6 MG ~~LOC~~ IMPL
3.6000 mg | DRUG_IMPLANT | Freq: Once | SUBCUTANEOUS | Status: AC
  Administered 2013-09-18: 3.6 mg via SUBCUTANEOUS
  Filled 2013-09-18: qty 3.6

## 2013-09-18 NOTE — Telephone Encounter (Signed)
, °

## 2013-09-19 ENCOUNTER — Ambulatory Visit: Payer: BC Managed Care – PPO

## 2013-09-26 ENCOUNTER — Ambulatory Visit
Admission: RE | Admit: 2013-09-26 | Discharge: 2013-09-26 | Disposition: A | Discharge status: Home / Self Care | Payer: BC Managed Care – PPO | Source: Ambulatory Visit | Attending: Gynecology | Admitting: Gynecology

## 2013-09-26 DIAGNOSIS — Z853 Personal history of malignant neoplasm of breast: Secondary | ICD-10-CM

## 2013-09-26 DIAGNOSIS — Z9889 Other specified postprocedural states: Secondary | ICD-10-CM

## 2013-10-02 ENCOUNTER — Other Ambulatory Visit: Payer: Self-pay | Admitting: Gynecology

## 2013-10-03 ENCOUNTER — Telehealth: Payer: Self-pay

## 2013-10-03 LAB — CYTOLOGY - PAP

## 2013-10-03 NOTE — Telephone Encounter (Signed)
Rcvd by fax from the [facility] dtd 09/26/16 mammogram results.  Copy to [place].  Original to scan.

## 2013-10-17 ENCOUNTER — Ambulatory Visit: Payer: BC Managed Care – PPO

## 2013-10-21 ENCOUNTER — Telehealth: Payer: Self-pay | Admitting: Adult Health

## 2013-10-24 ENCOUNTER — Ambulatory Visit (HOSPITAL_BASED_OUTPATIENT_CLINIC_OR_DEPARTMENT_OTHER): Payer: BC Managed Care – PPO

## 2013-10-24 VITALS — BP 121/84 | HR 67 | Temp 97.2°F

## 2013-10-24 DIAGNOSIS — C50419 Malignant neoplasm of upper-outer quadrant of unspecified female breast: Secondary | ICD-10-CM

## 2013-10-24 DIAGNOSIS — Z5111 Encounter for antineoplastic chemotherapy: Secondary | ICD-10-CM

## 2013-10-24 DIAGNOSIS — C50412 Malignant neoplasm of upper-outer quadrant of left female breast: Secondary | ICD-10-CM

## 2013-10-24 MED ORDER — GOSERELIN ACETATE 3.6 MG ~~LOC~~ IMPL
3.6000 mg | DRUG_IMPLANT | Freq: Once | SUBCUTANEOUS | Status: AC
  Administered 2013-10-24: 3.6 mg via SUBCUTANEOUS
  Filled 2013-10-24: qty 3.6

## 2013-10-24 NOTE — Patient Instructions (Signed)
Goserelin injection What is this medicine? GOSERELIN (GOE se rel in) is similar to a hormone found in the body. It lowers the amount of sex hormones that the body makes. Men will have lower testosterone levels and women will have lower estrogen levels while taking this medicine. In men, this medicine is used to treat prostate cancer; the injection is either given once per month or once every 12 weeks. A once per month injection (only) is used to treat women with endometriosis, dysfunctional uterine bleeding, or advanced breast cancer. This medicine may be used for other purposes; ask your health care provider or pharmacist if you have questions. COMMON BRAND NAME(S): Zoladex What should I tell my health care provider before I take this medicine? They need to know if you have any of these conditions (some only apply to women): -diabetes -heart disease or previous heart attack -high blood pressure -high cholesterol -kidney disease -osteoporosis or low bone density -problems passing urine -spinal cord injury -stroke -tobacco smoker -an unusual or allergic reaction to goserelin, hormone therapy, other medicines, foods, dyes, or preservatives -pregnant or trying to get pregnant -breast-feeding How should I use this medicine? This medicine is for injection under the skin. It is given by a health care professional in a hospital or clinic setting. Men receive this injection once every 4 weeks or once every 12 weeks. Women will only receive the once every 4 weeks injection. Talk to your pediatrician regarding the use of this medicine in children. Special care may be needed. Overdosage: If you think you have taken too much of this medicine contact a poison control center or emergency room at once. NOTE: This medicine is only for you. Do not share this medicine with others. What if I miss a dose? It is important not to miss your dose. Call your doctor or health care professional if you are unable to  keep an appointment. What may interact with this medicine? -female hormones like estrogen -herbal or dietary supplements like black cohosh, chasteberry, or DHEA -female hormones like testosterone -prasterone This list may not describe all possible interactions. Give your health care provider a list of all the medicines, herbs, non-prescription drugs, or dietary supplements you use. Also tell them if you smoke, drink alcohol, or use illegal drugs. Some items may interact with your medicine. What should I watch for while using this medicine? Visit your doctor or health care professional for regular checks on your progress. Your symptoms may appear to get worse during the first weeks of this therapy. Tell your doctor or healthcare professional if your symptoms do not start to get better or if they get worse after this time. Your bones may get weaker if you take this medicine for a long time. If you smoke or frequently drink alcohol you may increase your risk of bone loss. A family history of osteoporosis, chronic use of drugs for seizures (convulsions), or corticosteroids can also increase your risk of bone loss. Talk to your doctor about how to keep your bones strong. This medicine should stop regular monthly menstration in women. Tell your doctor if you continue to menstrate. Women should not become pregnant while taking this medicine or for 12 weeks after stopping this medicine. Women should inform their doctor if they wish to become pregnant or think they might be pregnant. There is a potential for serious side effects to an unborn child. Talk to your health care professional or pharmacist for more information. Do not breast-feed an infant while taking   this medicine. Men should inform their doctors if they wish to father a child. This medicine may lower sperm counts. Talk to your health care professional or pharmacist for more information. What side effects may I notice from receiving this  medicine? Side effects that you should report to your doctor or health care professional as soon as possible: -allergic reactions like skin rash, itching or hives, swelling of the face, lips, or tongue -bone pain -breathing problems -changes in vision -chest pain -feeling faint or lightheaded, falls -fever, chills -pain, swelling, warmth in the leg -pain, tingling, numbness in the hands or feet -signs and symptoms of low blood pressure like dizziness; feeling faint or lightheaded, falls; unusually weak or tired -stomach pain -swelling of the ankles, feet, hands -trouble passing urine or change in the amount of urine -unusually high or low blood pressure -unusually weak or tired Side effects that usually do not require medical attention (report to your doctor or health care professional if they continue or are bothersome): -change in sex drive or performance -changes in breast size in both males and females -changes in emotions or moods -headache -hot flashes -irritation at site where injected -loss of appetite -skin problems like acne, dry skin -vaginal dryness This list may not describe all possible side effects. Call your doctor for medical advice about side effects. You may report side effects to FDA at 1-800-FDA-1088. Where should I keep my medicine? This drug is given in a hospital or clinic and will not be stored at home. NOTE: This sheet is a summary. It may not cover all possible information. If you have questions about this medicine, talk to your doctor, pharmacist, or health care provider.  2015, Elsevier/Gold Standard. (2013-03-25 11:10:35)  

## 2013-11-13 ENCOUNTER — Other Ambulatory Visit: Payer: Self-pay | Admitting: *Deleted

## 2013-11-14 ENCOUNTER — Encounter: Payer: Self-pay | Admitting: Adult Health

## 2013-11-14 ENCOUNTER — Ambulatory Visit: Payer: BC Managed Care – PPO

## 2013-11-14 ENCOUNTER — Other Ambulatory Visit (HOSPITAL_BASED_OUTPATIENT_CLINIC_OR_DEPARTMENT_OTHER): Payer: BC Managed Care – PPO

## 2013-11-14 ENCOUNTER — Ambulatory Visit (HOSPITAL_BASED_OUTPATIENT_CLINIC_OR_DEPARTMENT_OTHER): Payer: BC Managed Care – PPO

## 2013-11-14 ENCOUNTER — Telehealth: Payer: Self-pay | Admitting: Adult Health

## 2013-11-14 ENCOUNTER — Ambulatory Visit (HOSPITAL_BASED_OUTPATIENT_CLINIC_OR_DEPARTMENT_OTHER): Payer: BC Managed Care – PPO | Admitting: Adult Health

## 2013-11-14 VITALS — BP 143/80 | HR 82 | Temp 97.9°F | Resp 20 | Ht 64.0 in | Wt 158.4 lb

## 2013-11-14 DIAGNOSIS — C50412 Malignant neoplasm of upper-outer quadrant of left female breast: Secondary | ICD-10-CM

## 2013-11-14 DIAGNOSIS — Z5111 Encounter for antineoplastic chemotherapy: Secondary | ICD-10-CM

## 2013-11-14 DIAGNOSIS — Z170 Estrogen receptor positive status [ER+]: Secondary | ICD-10-CM

## 2013-11-14 LAB — CBC WITH DIFFERENTIAL/PLATELET
BASO%: 0.4 % (ref 0.0–2.0)
Basophils Absolute: 0 10*3/uL (ref 0.0–0.1)
EOS ABS: 0 10*3/uL (ref 0.0–0.5)
EOS%: 0.1 % (ref 0.0–7.0)
HEMATOCRIT: 41.9 % (ref 34.8–46.6)
HGB: 14.2 g/dL (ref 11.6–15.9)
LYMPH%: 21.4 % (ref 14.0–49.7)
MCH: 30.7 pg (ref 25.1–34.0)
MCHC: 33.9 g/dL (ref 31.5–36.0)
MCV: 90.8 fL (ref 79.5–101.0)
MONO#: 0.3 10*3/uL (ref 0.1–0.9)
MONO%: 4.8 % (ref 0.0–14.0)
NEUT%: 73.3 % (ref 38.4–76.8)
NEUTROS ABS: 5.3 10*3/uL (ref 1.5–6.5)
PLATELETS: 227 10*3/uL (ref 145–400)
RBC: 4.62 10*6/uL (ref 3.70–5.45)
RDW: 12.3 % (ref 11.2–14.5)
WBC: 7.2 10*3/uL (ref 3.9–10.3)
lymph#: 1.5 10*3/uL (ref 0.9–3.3)

## 2013-11-14 LAB — COMPREHENSIVE METABOLIC PANEL (CC13)
ALT: 36 U/L (ref 0–55)
ANION GAP: 11 meq/L (ref 3–11)
AST: 23 U/L (ref 5–34)
Albumin: 3.9 g/dL (ref 3.5–5.0)
Alkaline Phosphatase: 63 U/L (ref 40–150)
BILIRUBIN TOTAL: 0.51 mg/dL (ref 0.20–1.20)
BUN: 12.9 mg/dL (ref 7.0–26.0)
CALCIUM: 9.8 mg/dL (ref 8.4–10.4)
CHLORIDE: 106 meq/L (ref 98–109)
CO2: 24 mEq/L (ref 22–29)
Creatinine: 0.9 mg/dL (ref 0.6–1.1)
GLUCOSE: 97 mg/dL (ref 70–140)
Potassium: 3.7 mEq/L (ref 3.5–5.1)
SODIUM: 141 meq/L (ref 136–145)
TOTAL PROTEIN: 6.5 g/dL (ref 6.4–8.3)

## 2013-11-14 MED ORDER — TAMOXIFEN CITRATE 20 MG PO TABS: 20.0000 mg | ORAL_TABLET | Freq: Every day | ORAL | Status: DC

## 2013-11-14 MED ORDER — GOSERELIN ACETATE 3.6 MG ~~LOC~~ IMPL
3.6000 mg | DRUG_IMPLANT | Freq: Once | SUBCUTANEOUS | Status: AC
  Administered 2013-11-14: 3.6 mg via SUBCUTANEOUS
  Filled 2013-11-14: qty 3.6

## 2013-11-14 NOTE — Progress Notes (Signed)
OFFICE PROGRESS NOTE  CC  REDDING Margaret Blonder., Margaret Reynolds [place] [address] Dr. Excell Seltzer Dr. Arloa Koh  DIAGNOSIS: 53year-old female with  diagnosis of left breast cancer.   Breast cancer of upper-outer quadrant of left female breast   Primary site: Breast (Left)   Staging method: AJCC 7th Edition   Clinical: Stage IA (T1c, N0, cM0)   Summary: Stage IA (T1c, N0, cM0)  PRIOR THERAPY: Margaret Reynolds is a 53y.o. female. With   #1 family history significant for mother with breast cancer at the age of 58. Patient has a history of dense breast she's been having high-risk mammography performed as well as MRIs. Recently she had an MRI of the breasts performed in the left breast at the 2:00 position there was noted to be a 1.9 cm mass nodes were within the left axilla were normal. She had an ultrasound performed and this confirmed at the 2:00 position a 1.2 cm area of concern. She underwent a biopsy. This showed a invasive ductal carcinoma grade 1 low grade node was negative. There was associated DCIS. Tumor was ER positive PR positive HER-2/neu negative with a proliferation marker Ki-67 of only 10%.   #2  status post lumpectomy of the left breast with the final pathology revealing 1.9 cm grade 1 invasive ductal carcinoma ER positive PR positive sentinel nodes were negative for metastatic disease. Patient had Oncotype DX testing performed that revealed a breast cancer recurrence score of 9 giving her a 7% risk of distant recurrence with antiestrogen therapy alone. This is in the low risk category. Patient and I discussed this today.  #3 s/p radiation therapy administered in [place] by Dr. Alroy Dust. Completed in January 2015.  #4 begin anti-estrogen therapy with tamoxifen 20 mg daily in February, 2015 and stopped it in August, 2015.  She was then started Zoladex on 09/18/2013.  Arimidex was sent into her pharmacy and she did not pick this up.      CURRENT THERAPY:  Zoladex and Arimidex  INTERVAL HISTORY: Margaret Reynolds 53 y.o. female returns for follow up visit.  She has been taking zoladex monthly and did not know or realize that she was supposed to be taking Armidex daily.  She still has hot flashes.  She denies any other problems.  She does continue to have some left arm pain since surgery.  We updated her health maintenance below.   MEDICAL HISTORY: Past Medical History  Diagnosis Date  . Breast cancer   . Hypertension   . Arthritis   . Contact lens/glasses fitting     wears contacts or glasses    ALLERGIES:  has No Known Allergies.  MEDICATIONS:  Current Outpatient Prescriptions  Medication Sig Dispense Refill  . anastrozole (ARIMIDEX) 1 MG tablet Take 1 tablet (1 mg total) by mouth daily.  30 tablet  3  . Calcium Citrate-Vitamin D (CITRACAL + D PO) Take by mouth.      . enalapril (VASOTEC) 5 MG tablet Take 5 mg by mouth daily.      . Venlafaxine HCl 37.5 MG TB24 Take 1 tablet (37.5 mg total) by mouth daily.  30 each  0   No current facility-administered medications for this visit.    SURGICAL HISTORY:  Past Surgical History  Procedure Laterality Date  . Breast lumpectomy  9622,2979    lt br,rt   . Minor fulgeration of anal condyloma  2007  . Cesarean section    . Bunionectomy  2013  right foot  . Colonoscopy    . Breast lumpectomy with needle localization and axillary sentinel lymph node bx Left 11/19/2012    Procedure: BREAST LUMPECTOMY WITH NEEDLE LOCALIZATION AND AXILLARY SENTINEL LYMPH NODE BIOPSY;  Surgeon: Haywood Lasso, Margaret Reynolds;  Location: [facility];  Service: General;  Laterality: Left;    REVIEW OF SYSTEMS:  A 10 point review of systems was conducted and is otherwise negative except for what is noted above.    Health Maintenance  Mammogram: 09/26/2013 Colonoscopy: 2013 Bone Density Scan: 2013 normal Pap Smear: 2014 Eye Exam: 10/2012 Vitamin D Level: never checked Lipid Panel:  2014     PHYSICAL EXAMINATION: Blood pressure 143/80, pulse 82, temperature 97.9 F (36.6 C), temperature source Oral, resp. rate 20, height '5\' 4"'  (1.626 m), weight 158 lb 6.4 oz (71.85 kg). Body mass index is 27.18 kg/(m^2). GENERAL: Patient is a well appearing female in no acute distress HEENT:  Sclerae anicteric.  Oropharynx clear and moist. No ulcerations or evidence of oropharyngeal candidiasis. Neck is supple.  NODES:  No cervical, supraclavicular, or axillary lymphadenopathy palpated.  BREAST EXAM:  Left breast lumpectomy without nodularity, no masses, no nodules, right breast no masses or nodules, benign bilateral breast exam.   LUNGS:  Clear to auscultation bilaterally.  No wheezes or rhonchi. HEART:  Regular rate and rhythm. No murmur appreciated. ABDOMEN:  Soft, nontender.  Positive, normoactive bowel sounds. No organomegaly palpated. MSK:  No focal spinal tenderness to palpation. Full range of motion bilaterally in the upper extremities. EXTREMITIES:  No peripheral edema.   SKIN:  Clear with no obvious rashes or skin changes. No nail dyscrasia. NEURO:  Nonfocal. Well oriented.  Appropriate affect. ECOG PERFORMANCE STATUS: 1 - Symptomatic but completely ambulatory    LABORATORY DATA: Lab Results  Component Value Date   WBC 7.2 11/14/2013   HGB 14.2 11/14/2013   HCT 41.9 11/14/2013   MCV 90.8 11/14/2013   PLT 227 11/14/2013      Chemistry      Component Value Date/Time   NA 142 07/09/2013 1100   K 4.0 07/09/2013 1100   CO2 27 07/09/2013 1100   BUN 9.1 07/09/2013 1100   CREATININE 0.8 07/09/2013 1100      Component Value Date/Time   CALCIUM 10.1 07/09/2013 1100   ALKPHOS 42 07/09/2013 1100   AST 18 07/09/2013 1100   ALT 24 07/09/2013 1100   BILITOT 0.54 07/09/2013 1100       RADIOGRAPHIC STUDIES:  No results found.  ASSESSMENT/PLAN: 53 year old female with   1. Stage I (T1N0) IDC s/p lumpectomy and radiation therapy.  She was originally on Tamoxifen daily,  and tells me that she was under the impression by Dr. Chancy Milroy that she could just take Zoladex monthly instead of Tamoxifen and that would be as good as Tamoxifen or Arimidex.  I informed her that she would have to take Zoladex and Arimidex daily.  I talked to her about either adding Arimidex as prescribed previously.  Margaret Reynolds will restart Tamoxifen.  I sent in a 90 day supply to her pharmacy.  I reviewed her mammogram results with her which demonstrated increased breast density.  She is considering a screening MRI next year following her next mammogram if her breasts remain dense.  I recommended healthy diet, exercise and monthly breast exams.  She will return in December for f/u with Dr. Jana Hakim.   All questions were answered. The patient knows to call the clinic with any problems,  questions or concerns. We can certainly see the patient much sooner if necessary.  I spent 25 minutes counseling the patient face to face. The total time spent in the appointment was 30 minutes.   Minette Headland, [place] [phone] 11/14/2013, 1:33 PM

## 2013-11-14 NOTE — Telephone Encounter (Signed)
, °

## 2013-11-14 NOTE — Patient Instructions (Signed)
Goserelin injection What is this medicine? GOSERELIN (GOE se rel in) is similar to a hormone found in the body. It lowers the amount of sex hormones that the body makes. Men will have lower testosterone levels and women will have lower estrogen levels while taking this medicine. In men, this medicine is used to treat prostate cancer; the injection is either given once per month or once every 12 weeks. A once per month injection (only) is used to treat women with endometriosis, dysfunctional uterine bleeding, or advanced breast cancer. This medicine may be used for other purposes; ask your health care provider or pharmacist if you have questions. COMMON BRAND NAME(S): Zoladex What should I tell my health care provider before I take this medicine? They need to know if you have any of these conditions (some only apply to women): -diabetes -heart disease or previous heart attack -high blood pressure -high cholesterol -kidney disease -osteoporosis or low bone density -problems passing urine -spinal cord injury -stroke -tobacco smoker -an unusual or allergic reaction to goserelin, hormone therapy, other medicines, foods, dyes, or preservatives -pregnant or trying to get pregnant -breast-feeding How should I use this medicine? This medicine is for injection under the skin. It is given by a health care professional in a hospital or clinic setting. Men receive this injection once every 4 weeks or once every 12 weeks. Women will only receive the once every 4 weeks injection. Talk to your pediatrician regarding the use of this medicine in children. Special care may be needed. Overdosage: If you think you have taken too much of this medicine contact a poison control center or emergency room at once. NOTE: This medicine is only for you. Do not share this medicine with others. What if I miss a dose? It is important not to miss your dose. Call your doctor or health care professional if you are unable to  keep an appointment. What may interact with this medicine? -female hormones like estrogen -herbal or dietary supplements like black cohosh, chasteberry, or DHEA -female hormones like testosterone -prasterone This list may not describe all possible interactions. Give your health care provider a list of all the medicines, herbs, non-prescription drugs, or dietary supplements you use. Also tell them if you smoke, drink alcohol, or use illegal drugs. Some items may interact with your medicine. What should I watch for while using this medicine? Visit your doctor or health care professional for regular checks on your progress. Your symptoms may appear to get worse during the first weeks of this therapy. Tell your doctor or healthcare professional if your symptoms do not start to get better or if they get worse after this time. Your bones may get weaker if you take this medicine for a long time. If you smoke or frequently drink alcohol you may increase your risk of bone loss. A family history of osteoporosis, chronic use of drugs for seizures (convulsions), or corticosteroids can also increase your risk of bone loss. Talk to your doctor about how to keep your bones strong. This medicine should stop regular monthly menstration in women. Tell your doctor if you continue to menstrate. Women should not become pregnant while taking this medicine or for 12 weeks after stopping this medicine. Women should inform their doctor if they wish to become pregnant or think they might be pregnant. There is a potential for serious side effects to an unborn child. Talk to your health care professional or pharmacist for more information. Do not breast-feed an infant while taking   this medicine. Men should inform their doctors if they wish to father a child. This medicine may lower sperm counts. Talk to your health care professional or pharmacist for more information. What side effects may I notice from receiving this  medicine? Side effects that you should report to your doctor or health care professional as soon as possible: -allergic reactions like skin rash, itching or hives, swelling of the face, lips, or tongue -bone pain -breathing problems -changes in vision -chest pain -feeling faint or lightheaded, falls -fever, chills -pain, swelling, warmth in the leg -pain, tingling, numbness in the hands or feet -signs and symptoms of low blood pressure like dizziness; feeling faint or lightheaded, falls; unusually weak or tired -stomach pain -swelling of the ankles, feet, hands -trouble passing urine or change in the amount of urine -unusually high or low blood pressure -unusually weak or tired Side effects that usually do not require medical attention (report to your doctor or health care professional if they continue or are bothersome): -change in sex drive or performance -changes in breast size in both males and females -changes in emotions or moods -headache -hot flashes -irritation at site where injected -loss of appetite -skin problems like acne, dry skin -vaginal dryness This list may not describe all possible side effects. Call your doctor for medical advice about side effects. You may report side effects to FDA at 1-800-FDA-1088. Where should I keep my medicine? This drug is given in a hospital or clinic and will not be stored at home. NOTE: This sheet is a summary. It may not cover all possible information. If you have questions about this medicine, talk to your doctor, pharmacist, or health care provider.  2015, Elsevier/Gold Standard. (2013-03-25 11:10:35)  

## 2013-11-14 NOTE — Patient Instructions (Signed)
You are doing well today. You have no sign of recurrence.  Start taking Tamoxifen daily.  You no longer need to take any injections.    Tamoxifen oral tablet What is this medicine? TAMOXIFEN (ta MOX i fen) blocks the effects of estrogen. It is commonly used to treat breast cancer. It is also used to decrease the chance of breast cancer coming back in women who have received treatment for the disease. It may also help prevent breast cancer in women who have a high risk of developing breast cancer. This medicine may be used for other purposes; ask your health care provider or pharmacist if you have questions. COMMON BRAND NAME(S): Nolvadex What should I tell my health care provider before I take this medicine? They need to know if you have any of these conditions: -blood clots -blood disease -cataracts or impaired eyesight -endometriosis -high calcium levels -high cholesterol -irregular menstrual cycles -liver disease -stroke -uterine fibroids -an unusual or allergic reaction to tamoxifen, other medicines, foods, dyes, or preservatives -pregnant or trying to get pregnant -breast-feeding How should I use this medicine? Take this medicine by mouth with a glass of water. Follow the directions on the prescription label. You can take it with or without food. Take your medicine at regular intervals. Do not take your medicine more often than directed. Do not stop taking except on your doctor's advice. A special MedGuide will be given to you by the pharmacist with each prescription and refill. Be sure to read this information carefully each time. Talk to your pediatrician regarding the use of this medicine in children. While this drug may be prescribed for selected conditions, precautions do apply. Overdosage: If you think you have taken too much of this medicine contact a poison control center or emergency room at once. NOTE: This medicine is only for you. Do not share this medicine with  others. What if I miss a dose? If you miss a dose, take it as soon as you can. If it is almost time for your next dose, take only that dose. Do not take double or extra doses. What may interact with this medicine? -aminoglutethimide -bromocriptine -chemotherapy drugs -female hormones, like estrogens and birth control pills -letrozole -medroxyprogesterone -phenobarbital -rifampin -warfarin This list may not describe all possible interactions. Give your health care provider a list of all the medicines, herbs, non-prescription drugs, or dietary supplements you use. Also tell them if you smoke, drink alcohol, or use illegal drugs. Some items may interact with your medicine. What should I watch for while using this medicine? Visit your doctor or health care professional for regular checks on your progress. You will need regular pelvic exams, breast exams, and mammograms. If you are taking this medicine to reduce your risk of getting breast cancer, you should know that this medicine does not prevent all types of breast cancer. If breast cancer or other problems occur, there is no guarantee that it will be found at an early stage. Do not become pregnant while taking this medicine or for 2 months after stopping this medicine. Stop taking this medicine if you get pregnant or think you are pregnant and contact your doctor. This medicine may harm your unborn baby. Women who can possibly become pregnant should use birth control methods that do not use hormones during tamoxifen treatment and for 2 months after therapy has stopped. Talk with your health care provider for birth control advice. Do not breast feed while taking this medicine. What side effects may I  notice from receiving this medicine? Side effects that you should report to your doctor or health care professional as soon as possible: -changes in vision (blurred vision) -changes in your menstrual cycle -difficulty breathing or shortness of  breath -difficulty walking or talking -new breast lumps -numbness -pelvic pain or pressure -redness, blistering, peeling or loosening of the skin, including inside the mouth -skin rash or itching (hives) -sudden chest pain -swelling of lips, face, or tongue -swelling, pain or tenderness in your calf or leg -unusual bruising or bleeding -vaginal discharge that is bloody, brown, or rust -weakness -yellowing of the whites of the eyes or skin Side effects that usually do not require medical attention (report to your doctor or health care professional if they continue or are bothersome): -fatigue -hair loss, although uncommon and is usually mild -headache -hot flashes -impotence (in men) -nausea, vomiting (mild) -vaginal discharge (white or clear) This list may not describe all possible side effects. Call your doctor for medical advice about side effects. You may report side effects to FDA at 1-800-FDA-1088. Where should I keep my medicine? Keep out of the reach of children. Store at room temperature between 20 and 25 degrees C (68 and 77 degrees F). Protect from light. Keep container tightly closed. Throw away any unused medicine after the expiration date. NOTE: This sheet is a summary. It may not cover all possible information. If you have questions about this medicine, talk to your doctor, pharmacist, or health care provider.  2015, Elsevier/Gold Standard. (2007-10-03 12:01:56)

## 2013-11-21 ENCOUNTER — Encounter: Payer: Self-pay | Admitting: Adult Health

## 2013-11-25 ENCOUNTER — Encounter: Payer: Self-pay | Admitting: Adult Health

## 2013-12-05 ENCOUNTER — Encounter: Payer: Self-pay | Admitting: *Deleted

## 2013-12-05 NOTE — Progress Notes (Unsigned)
SPOKE TO BETH SMITH,RN [LEAD NAVIGATOR] CONCERNING AN ABBREVIATED MRI. SHE WILL OBTAIN INFORMATION ABOUT THE ABBREVIATED MRI AND CALL PT.

## 2013-12-08 ENCOUNTER — Telehealth: Payer: Self-pay | Admitting: *Deleted

## 2013-12-08 NOTE — Telephone Encounter (Signed)
Left vm on pt cell phone about abbreviated breast MRI. Stated that we currently do not offer abbreviated breast MRI and left contact information for pt to call back to further discuss.

## 2013-12-12 ENCOUNTER — Ambulatory Visit: Payer: BC Managed Care – PPO

## 2014-01-07 ENCOUNTER — Other Ambulatory Visit: Payer: Self-pay | Admitting: *Deleted

## 2014-01-08 ENCOUNTER — Other Ambulatory Visit: Payer: BC Managed Care – PPO

## 2014-01-09 ENCOUNTER — Ambulatory Visit: Payer: BC Managed Care – PPO

## 2014-01-15 ENCOUNTER — Ambulatory Visit: Payer: BC Managed Care – PPO | Admitting: Oncology

## 2014-01-15 ENCOUNTER — Other Ambulatory Visit: Payer: BC Managed Care – PPO

## 2014-01-16 ENCOUNTER — Other Ambulatory Visit: Payer: Self-pay | Admitting: *Deleted

## 2014-01-16 DIAGNOSIS — C50412 Malignant neoplasm of upper-outer quadrant of left female breast: Secondary | ICD-10-CM

## 2014-01-19 ENCOUNTER — Other Ambulatory Visit: Payer: Self-pay | Admitting: Oncology

## 2014-01-19 ENCOUNTER — Telehealth: Payer: Self-pay | Admitting: Oncology

## 2014-01-19 ENCOUNTER — Encounter (HOSPITAL_BASED_OUTPATIENT_CLINIC_OR_DEPARTMENT_OTHER): Payer: BC Managed Care – PPO | Admitting: Oncology

## 2014-01-19 ENCOUNTER — Ambulatory Visit (HOSPITAL_BASED_OUTPATIENT_CLINIC_OR_DEPARTMENT_OTHER): Payer: BC Managed Care – PPO | Admitting: Oncology

## 2014-01-19 VITALS — BP 124/70 | HR 87 | Temp 98.3°F | Resp 18 | Ht 64.0 in | Wt 158.2 lb

## 2014-01-19 DIAGNOSIS — C50412 Malignant neoplasm of upper-outer quadrant of left female breast: Secondary | ICD-10-CM

## 2014-01-19 DIAGNOSIS — N6489 Other specified disorders of breast: Secondary | ICD-10-CM

## 2014-01-19 DIAGNOSIS — Z170 Estrogen receptor positive status [ER+]: Secondary | ICD-10-CM

## 2014-01-19 LAB — COMPREHENSIVE METABOLIC PANEL (CC13)
ALT: 23 U/L (ref 0–55)
ANION GAP: 11 meq/L (ref 3–11)
AST: 21 U/L (ref 5–34)
Albumin: 4 g/dL (ref 3.5–5.0)
Alkaline Phosphatase: 50 U/L (ref 40–150)
BUN: 13.9 mg/dL (ref 7.0–26.0)
CALCIUM: 9.2 mg/dL (ref 8.4–10.4)
CHLORIDE: 107 meq/L (ref 98–109)
CO2: 24 mEq/L (ref 22–29)
Creatinine: 0.9 mg/dL (ref 0.6–1.1)
EGFR: 78 mL/min/{1.73_m2} — ABNORMAL LOW (ref >90)
Glucose: 108 mg/dl (ref 70–140)
Potassium: 3.9 mEq/L (ref 3.5–5.1)
Sodium: 142 mEq/L (ref 136–145)
Total Bilirubin: 0.3 mg/dL (ref 0.20–1.20)
Total Protein: 6.8 g/dL (ref 6.4–8.3)

## 2014-01-19 LAB — CBC WITH DIFFERENTIAL/PLATELET
BASO%: 0.3 % (ref 0.0–2.0)
Basophils Absolute: 0 10*3/uL (ref 0.0–0.1)
EOS%: 1.1 % (ref 0.0–7.0)
Eosinophils Absolute: 0.1 10*3/uL (ref 0.0–0.5)
HCT: 40.4 % (ref 34.8–46.6)
HEMOGLOBIN: 14.1 g/dL (ref 11.6–15.9)
LYMPH#: 2.1 10*3/uL (ref 0.9–3.3)
LYMPH%: 32.1 % (ref 14.0–49.7)
MCH: 30.9 pg (ref 25.1–34.0)
MCHC: 34.9 g/dL (ref 31.5–36.0)
MCV: 88.6 fL (ref 79.5–101.0)
MONO#: 0.3 10*3/uL (ref 0.1–0.9)
MONO%: 4.1 % (ref 0.0–14.0)
NEUT#: 4.1 10*3/uL (ref 1.5–6.5)
NEUT%: 62.4 % (ref 38.4–76.8)
PLATELETS: 237 10*3/uL (ref 145–400)
RBC: 4.56 10*6/uL (ref 3.70–5.45)
RDW: 12 % (ref 11.2–14.5)
WBC: 6.6 10*3/uL (ref 3.9–10.3)

## 2014-01-19 NOTE — Progress Notes (Signed)
[facility]  [phone] [phone]     ID: [name] DOB: Oct 23, 1960  MR#: [id number]  SWN#:[id number]  Patient Care Team: Angelina Sheriff, MD as PCP - General (Family Medicine) PCP: Angelina Sheriff., MD GYN: SU: Excell Seltzer OTHER MD:  CHIEF COMPLAINT:   CURRENT TREATMENT:    BREAST CANCER HISTORY: From Dr Bernell List Khan's intake note 11/18/2012:  "Margaret Reynolds is a 53 y.o. female. With family history significant for mother with breast cancer at the age of 55. Patient has a history of dense breast she's been having high-risk mammography performed as well as MRIs. Recently she had an MRI of the breasts performed in the left breast at the 2:00 position there was noted to be a 1.9 cm mass nodes were within the left axilla were normal. She had an ultrasound performed and this confirmed at the 2:00 position a 1.2 cm area of concern. She underwent a biopsy. This showed a invasive ductal carcinoma grade 1 low grade node was negative. There was associated DCIS. Tumor was ER positive PR positive HER-2/neu negative with a proliferation marker Ki-67 of only 10%. Patient's case was discussed at the multidisciplinary breast conference her pathology and radiology were reviewed. Treatment plan was made. Patient was seen also by Dr. Arloa Koh and Dr. Neldon Mc. Patient herself is without any significant complaints."  Her subsequent history is as detailed below.  INTERVAL HISTORY: [name] returns today for follow-up of her breast cancer, accompanied by her mother Margaret Reynolds (who is also my patient). Beretta is establishing herself on my service today. She is currently on tamoxifen, after having been off anti-estrogens for several months because she thought she understood that if she was receiving Zoladex she did not have to take any pills. That was clarified at her last visit here in mid October. Currently she is tolerating the tamoxifen well with  moderate hot flashes and no significant vaginal dryness concerns. She obtains a drug at a good price.  REVIEW OF SYSTEMS: A detailed review of systems today was otherwise noncontributory. She exercises chiefly by "walking at work". She does not belong to a gym.  PAST MEDICAL HISTORY: Past Medical History  Diagnosis Date  . Breast cancer   . Hypertension   . Arthritis   . Contact lens/glasses fitting     wears contacts or glasses    PAST SURGICAL HISTORY: Past Surgical History  Procedure Laterality Date  . Breast lumpectomy  [id number]    lt br,rt   . Minor fulgeration of anal condyloma  2007  . Cesarean section    . Bunionectomy  2013    right foot  . Colonoscopy    . Breast lumpectomy with needle localization and axillary sentinel lymph node bx Left 11/19/2012    Procedure: BREAST LUMPECTOMY WITH NEEDLE LOCALIZATION AND AXILLARY SENTINEL LYMPH NODE BIOPSY;  Surgeon: Haywood Lasso, MD;  Location: [facility];  Service: General;  Laterality: Left;    FAMILY HISTORY Family History  Problem Relation Age of Onset  . Breast cancer Mother 16  . Melanoma Father 86  . Cervical cancer Maternal Grandmother 41  . Cancer Paternal Uncle     Cancer NOS   the patient's father died from a heart attack at the age of 73. He had a history of melanoma, for which he received interleukin, and also myasthenia gravis. The patient's mother is living. She was diagnosed with breast cancer in her 62s. The patient had  no sisters.She has 2 brothers. There is no other history of breast or ovarian cancer in the family to her knowledge  GYNECOLOGIC HISTORY:  No LMP recorded. Patient is postmenopausal. Menarche age 25, first live birth at 10. She is GX P2. She stopped having periods several years ago, when she had an implant placed. She had the implant removed she thinks 2 or 3 years ago and her periods did not resume after that. She received Zoladex here monthly beginning mid-July 2015 and  stopping 11/14/2013. She noted little difference when going off Zoladex.   SOCIAL HISTORY:  The patient works as an Conservator, museum/gallery. Her husband of 79 years, Margaret Reynolds, is a Administrator. Son Margaret Reynolds works in the [employer]. Son Margaret Reynolds works in [place] and her conditioning. The patient has 1 grandchild. She is a Tourist information centre manager    ADVANCED DIRECTIVES: Not in place   HEALTH MAINTENANCE: History  Substance Use Topics  . Smoking status: Former Smoker -- 0.30 packs/day for 30 years    Quit date: 11/14/2008  . Smokeless tobacco: Not on file  . Alcohol Use: 4.2 oz/week    7 Cans of beer per week     Comment: occ     Colonoscopy: At age 24/Dr. Melina Copa  PAP:  Bone density:  Lipid panel:  No Known Allergies  Current Outpatient Prescriptions  Medication Sig Dispense Refill  . Calcium Citrate-Vitamin D (CITRACAL + D PO) Take by mouth.    . enalapril (VASOTEC) 5 MG tablet Take 5 mg by mouth daily.    . tamoxifen (NOLVADEX) 20 MG tablet Take 1 tablet (20 mg total) by mouth daily. 90 tablet 3   No current facility-administered medications for this visit.    OBJECTIVE: Middle-aged white woman who appears well Filed Vitals:   01/19/14 1455  BP: 124/70  Pulse: 87  Temp: 98.3 F (36.8 C)  Resp: 18     Body mass index is 27.14 kg/(m^2).    ECOG FS:1 - Symptomatic but completely ambulatory  Ocular: Sclerae unicteric, pupils equal, round and reactive to light Ear-nose-throat: Oropharynx clear and moist Lymphatic: No cervical or supraclavicular adenopathy Lungs no rales or rhonchi, good excursion bilaterally Heart regular rate and rhythm, no murmur appreciated Abd soft, nontender, positive bowel sounds MSK no focal spinal tenderness, no joint edema Neuro: non-focal, well-oriented, appropriate affect Breasts: The right breast is unremarkable. The left breast is status post lumpectomy and radiation. There is no evidence of local recurrence. Left axilla feels a little fuller the patient. There is  some irregularity which is stable will be postoperative.   LAB RESULTS:  CMP     Component Value Date/Time   NA 142 01/19/2014 1411   K 3.9 01/19/2014 1411   CO2 24 01/19/2014 1411   GLUCOSE 108 01/19/2014 1411   BUN 13.9 01/19/2014 1411   CREATININE 0.9 01/19/2014 1411   CALCIUM 9.2 01/19/2014 1411   PROT 6.8 01/19/2014 1411   ALBUMIN 4.0 01/19/2014 1411   AST 21 01/19/2014 1411   ALT 23 01/19/2014 1411   ALKPHOS 50 01/19/2014 1411   BILITOT 0.30 01/19/2014 1411    INo results found for: SPEP, UPEP  Lab Results  Component Value Date   WBC 6.6 01/19/2014   NEUTROABS 4.1 01/19/2014   HGB 14.1 01/19/2014   HCT 40.4 01/19/2014   MCV 88.6 01/19/2014   PLT 237 01/19/2014      Chemistry      Component Value Date/Time   NA 142 01/19/2014 1411  K 3.9 01/19/2014 1411   CO2 24 01/19/2014 1411   BUN 13.9 01/19/2014 1411   CREATININE 0.9 01/19/2014 1411      Component Value Date/Time   CALCIUM 9.2 01/19/2014 1411   ALKPHOS 50 01/19/2014 1411   AST 21 01/19/2014 1411   ALT 23 01/19/2014 1411   BILITOT 0.30 01/19/2014 1411       No results found for: LABCA2  No components found for: LABCA125  No results for input(s): INR in the last 168 hours.  Urinalysis No results found for: COLORURINE, APPEARANCEUR, LABSPEC, PHURINE, GLUCOSEU, HGBUR, BILIRUBINUR, KETONESUR, PROTEINUR, UROBILINOGEN, NITRITE, LEUKOCYTESUR  STUDIES: EXAM: DIGITAL DIAGNOSTIC BILATERAL MAMMOGRAM WITH CAD  COMPARISON: 11/19/2012 and earlier  ACR Breast Density Category c: The breast tissue is heterogeneously dense, which may obscure small masses.  FINDINGS: Postoperative changes are seen in the left breast. No suspicious mass, distortion, or microcalcifications are identified to suggest presence of malignancy.  Mammographic images were processed with CAD.  IMPRESSION: No mammographic evidence for malignancy.  RECOMMENDATION: Diagnostic mammogram is suggested in 1 year.  (Code:DM-B-01Y)  I have discussed the findings and recommendations with the patient. Results were also provided in writing at the conclusion of the visit. If applicable, a reminder letter will be sent to the patient regarding the next appointment.  BI-RADS CATEGORY 2: Benign.   Electronically Signed  By: Shon Hale M.D.  On: 09/26/2013 10:24       ASSESSMENT: 53 y.o. [place] woman status post left breast biopsy 10/21/2012 for a clinical T1c N0 invasive ductal carcinoma, grade 1, estrogen receptor and progesterone receptor both at 100% positive with strong staining intensity, with an MIB-1 of 10%, and no HER-2 amplification, the signals ratio being 1.18. ([id number])  (1) biopsy of a suspicious left axillary lymph node 10/21/2012 was negative  (2) status post left lumpectomy and sentinel lymph node sampling 11/19/2012 for a pT1c pN0, stage IA invasive ductal carcinoma, grade 1, with negative margins. Repeat HER-2 was again negative ([id number])  (3) Oncotype DX score of 9 predicts a risk of recurrence outside the breast within the next 10 years of 7% if the patient's only systemic treatment is tamoxifen for 5 years. It also predicts no benefit from chemotherapy.  (4) radiation therapy completed in [facility] January 2015  (5) tamoxifen started 03/20/2013.  (a) switched to anastrozole July 2015 (but never started)  (b) estradiol level of 41 and FSH 22.8 noted 07/09/2013; goserelin started 08/22/2013, discontinued 11/14/2013  (c) tamoxifen resumed October 2015  PLAN: I spent approximately 50 minutes with kidney going over her diagnosis, prognosis, and treatment history. She had understood that if she took the monthly shots she would not have to taken antiestrogen pills, but of course that is not the case. When that became clear, she went back on tamoxifen and of course that eliminated the reason for her to be on goserelin. Accordingly that was stopped.  We  then discussed the fact that to get the full benefit of tamoxifen ideally she would take it for a total of 10 years. Alternatively she could take tamoxifen for 2 years and switch to an aromatase inhibitor for an additional 3 years. The benefit of these changes (as compared to 5 years of tamoxifen) likely would be small, perhaps an additional 2% decrease in risk below the 7% predicted by her Oncotype. That of course is not insignificant, particularly if she is tolerating her anti-estrogens well.  We left it that we will discuss this  again when she completes 2 years of tamoxifen, counting from October 2015. She will see [place] again in April and October of next year. She will have a repeat mammogram before the October visit. She understands whatever choice she makes she has a very good prognosis overall.  Leylanie now has a good understanding of the overall plan. She agrees with it. She knows the goal of treatment in her case is cure. She will call with any problems that may develop before her next visit here.  Chauncey Cruel, MD   01/19/2014 6:02 PM Medical Oncology and Hematology [facility] [address] [place], [place] [address] Tel. [phone]    Fax. [phone]

## 2014-01-19 NOTE — Telephone Encounter (Signed)
, °

## 2014-01-29 ENCOUNTER — Ambulatory Visit
Admission: RE | Admit: 2014-01-29 | Discharge: 2014-01-29 | Disposition: A | Discharge status: Home / Self Care | Payer: BC Managed Care – PPO | Source: Ambulatory Visit | Attending: Oncology | Admitting: Oncology

## 2014-01-29 DIAGNOSIS — N6489 Other specified disorders of breast: Secondary | ICD-10-CM

## 2014-03-24 ENCOUNTER — Other Ambulatory Visit: Payer: Self-pay | Admitting: Gynecology

## 2014-03-25 LAB — CYTOLOGY - PAP

## 2014-05-11 ENCOUNTER — Telehealth: Payer: Self-pay | Admitting: Nurse Practitioner

## 2014-05-11 NOTE — Telephone Encounter (Signed)
pt cld to CX appts-stated no insurance

## 2014-05-22 ENCOUNTER — Other Ambulatory Visit: Payer: BC Managed Care – PPO

## 2014-05-22 ENCOUNTER — Ambulatory Visit: Payer: BC Managed Care – PPO | Admitting: Nurse Practitioner

## 2014-08-31 ENCOUNTER — Other Ambulatory Visit: Payer: Self-pay | Admitting: Oncology

## 2014-08-31 DIAGNOSIS — N6489 Other specified disorders of breast: Secondary | ICD-10-CM

## 2014-09-28 ENCOUNTER — Ambulatory Visit
Admission: RE | Admit: 2014-09-28 | Discharge: 2014-09-28 | Disposition: A | Discharge status: Home / Self Care | Payer: No Typology Code available for payment source | Source: Ambulatory Visit | Attending: Oncology | Admitting: Oncology

## 2014-09-28 ENCOUNTER — Other Ambulatory Visit: Payer: Self-pay | Admitting: Oncology

## 2014-09-28 DIAGNOSIS — C50919 Malignant neoplasm of unspecified site of unspecified female breast: Secondary | ICD-10-CM

## 2014-09-28 DIAGNOSIS — N6489 Other specified disorders of breast: Secondary | ICD-10-CM

## 2014-09-29 ENCOUNTER — Other Ambulatory Visit: Payer: Self-pay | Admitting: *Deleted

## 2014-10-12 ENCOUNTER — Ambulatory Visit
Admission: RE | Admit: 2014-10-12 | Discharge: 2014-10-12 | Disposition: A | Discharge status: Home / Self Care | Payer: PRIVATE HEALTH INSURANCE | Source: Ambulatory Visit | Attending: Oncology | Admitting: Oncology

## 2014-10-12 DIAGNOSIS — C50919 Malignant neoplasm of unspecified site of unspecified female breast: Secondary | ICD-10-CM

## 2014-10-12 MED ORDER — GADOBENATE DIMEGLUMINE 529 MG/ML IV SOLN
14.0000 mL | Freq: Once | INTRAVENOUS | Status: AC | PRN
Start: 2014-10-12 — End: 2014-10-12
  Administered 2014-10-12: 14 mL via INTRAVENOUS

## 2014-10-13 ENCOUNTER — Other Ambulatory Visit: Payer: Self-pay | Admitting: Oncology

## 2014-10-13 ENCOUNTER — Other Ambulatory Visit: Payer: Self-pay | Admitting: Gynecology

## 2014-10-14 ENCOUNTER — Encounter: Payer: Self-pay | Admitting: *Deleted

## 2014-10-14 LAB — CYTOLOGY - PAP

## 2014-10-14 NOTE — Progress Notes (Signed)
[place] Work  Clinical Social Work was referred by Futures trader for assistance with insurance concerns. CSW phoned pt. 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 She also reports her husband is unemployed currently.  Pt interested in [facility] insurance options and was educated about open enrollment dates and how to apply. Pt plans to look into and had questions about MRI. CSW deferred her questions back to the medical team. Pt did not appear to meet criteria for medicaid currently.  Pt plans to call [facility] for her medical question and agrees to phone CSW as needed.   Clinical Social Work interventions: Resource education  Loren Racer, [place] Worker [place]  [place] Phone: [phone] Fax: [phone]

## 2014-11-04 ENCOUNTER — Encounter: Payer: Self-pay | Admitting: Oncology

## 2014-11-10 ENCOUNTER — Other Ambulatory Visit: Payer: Self-pay | Admitting: Oncology

## 2014-11-10 ENCOUNTER — Telehealth: Payer: Self-pay

## 2014-11-10 NOTE — Telephone Encounter (Signed)
appt made. pof sent

## 2014-11-10 NOTE — Telephone Encounter (Signed)
Appt made with Gentry Fitz Wed 10/12 at 945.  Pt voiced understanding.  urgent pof sent

## 2014-11-11 ENCOUNTER — Encounter: Payer: Self-pay | Admitting: Nurse Practitioner

## 2014-11-11 ENCOUNTER — Telehealth: Payer: Self-pay | Admitting: Oncology

## 2014-11-11 ENCOUNTER — Ambulatory Visit (HOSPITAL_BASED_OUTPATIENT_CLINIC_OR_DEPARTMENT_OTHER): Payer: Self-pay | Admitting: Nurse Practitioner

## 2014-11-11 VITALS — BP 140/85 | HR 70 | Temp 97.3°F | Resp 18 | Ht 64.0 in | Wt 147.6 lb

## 2014-11-11 DIAGNOSIS — C50412 Malignant neoplasm of upper-outer quadrant of left female breast: Secondary | ICD-10-CM

## 2014-11-11 DIAGNOSIS — M79602 Pain in left arm: Secondary | ICD-10-CM

## 2014-11-11 DIAGNOSIS — R232 Flushing: Secondary | ICD-10-CM

## 2014-11-11 DIAGNOSIS — N951 Menopausal and female climacteric states: Secondary | ICD-10-CM

## 2014-11-11 DIAGNOSIS — Z170 Estrogen receptor positive status [ER+]: Secondary | ICD-10-CM

## 2014-11-11 MED ORDER — GABAPENTIN 300 MG PO CAPS: 300.0000 mg | ORAL_CAPSULE | Freq: Two times a day (BID) | ORAL | Status: DC

## 2014-11-11 MED ORDER — TAMOXIFEN CITRATE 20 MG PO TABS: 20.0000 mg | ORAL_TABLET | Freq: Every day | ORAL | Status: DC

## 2014-11-11 NOTE — Progress Notes (Signed)
[place]  Telephone:(336) [phone] Fax:(336) [phone]     ID: EMALIA WITKOP DOB: October 03, 1960  MR#: [id number]  QAS#:[id number]  Patient Care Team: Angelina Sheriff, MD as PCP - General (Family Medicine) PCP: Angelina Sheriff., MD GYN: SU: Excell Seltzer OTHER MD:  CHIEF COMPLAINT:   CURRENT TREATMENT:    BREAST CANCER HISTORY: From Dr Bernell List Khan's intake note 11/18/2012:  "Margaret Reynolds is a 54 y.o. female. With family history significant for mother with breast cancer at the age of 35. Patient has a history of dense breast she's been having high-risk mammography performed as well as MRIs. Recently she had an MRI of the breasts performed in the left breast at the 2:00 position there was noted to be a 1.9 cm mass nodes were within the left axilla were normal. She had an ultrasound performed and this confirmed at the 2:00 position a 1.2 cm area of concern. She underwent a biopsy. This showed a invasive ductal carcinoma grade 1 low grade node was negative. There was associated DCIS. Tumor was ER positive PR positive HER-2/neu negative with a proliferation marker Ki-67 of only 10%. Patient's case was discussed at the multidisciplinary breast conference her pathology and radiology were reviewed. Treatment plan was made. Patient was seen also by Dr. Arloa Koh and Dr. Neldon Mc. Patient herself is without any significant complaints."  Her subsequent history is as detailed below.  INTERVAL HISTORY: Margaret Reynolds returns today for follow-up of her breast cancer, accompanied by her mother Margaret Reynolds. The interval history is remarkable for pain and numbness to her left arm, especially after waking up. She has pain and fullness to her left axilla near the site where lymph nodes were removed. She has a history of a seroma there that has been drained several times in the past 12 months. She had a mammogram and ultrasound of this area that was negative for malignancy. A follow  up breast MRI was also negative for correlating findings. Margaret Reynolds has not been on her tamoxifen for the past 3 months because she no longer has air conditioning in her home, and it was making her hot flashes worse.   REVIEW OF SYSTEMS: A detailed review of systems is otherwise stable, except where noted above.  PAST MEDICAL HISTORY: Past Medical History  Diagnosis Date  . Breast cancer   . Hypertension   . Arthritis   . Contact lens/glasses fitting     wears contacts or glasses    PAST SURGICAL HISTORY: Past Surgical History  Procedure Laterality Date  . Breast lumpectomy  7989,2119    lt br,rt   . Minor fulgeration of anal condyloma  2007  . Cesarean section    . Bunionectomy  2013    right foot  . Colonoscopy    . Breast lumpectomy with needle localization and axillary sentinel lymph node bx Left 11/19/2012    Procedure: BREAST LUMPECTOMY WITH NEEDLE LOCALIZATION AND AXILLARY SENTINEL LYMPH NODE BIOPSY;  Surgeon: Haywood Lasso, MD;  Location: [place];  Service: General;  Laterality: Left;    FAMILY HISTORY Family History  Problem Relation Age of Onset  . Breast cancer Mother 5  . Melanoma Father 12  . Cervical cancer Maternal Grandmother 51  . Cancer Paternal Uncle     Cancer NOS   the patient's father died from a heart attack at the age of 66. He had a history of melanoma, for which he received interleukin, and also myasthenia gravis.  The patient's mother is living. She was diagnosed with breast cancer in her 45s. The patient had no sisters.She has 2 brothers. There is no other history of breast or ovarian cancer in the family to her knowledge  GYNECOLOGIC HISTORY:  No LMP recorded. Patient is postmenopausal. Menarche age 34, first live birth at 50. She is GX P2. She stopped having periods several years ago, when she had an implant placed. She had the implant removed she thinks 2 or 3 years ago and her periods did not resume after that. She received  Zoladex here monthly beginning mid-July 2015 and stopping 11/14/2013. She noted little difference when going off Zoladex.   SOCIAL HISTORY:  The patient works as an Conservator, museum/gallery. Her husband of 73 years, Margaret Reynolds, is a Administrator. Son Margaret Reynolds works in the [employer]. Son Margaret Reynolds works in [place] and her conditioning. The patient has 1 grandchild. She is a Tourist information centre manager    ADVANCED DIRECTIVES: Not in place   HEALTH MAINTENANCE: Social History  Substance Use Topics  . Smoking status: Former Smoker -- 0.30 packs/day for 30 years    Quit date: 11/14/2008  . Smokeless tobacco: Not on file  . Alcohol Use: 4.2 oz/week    7 Cans of beer per week     Comment: occ     Colonoscopy: At age 66/Dr. Melina Copa  PAP:  Bone density:  Lipid panel:  No Known Allergies  Current Outpatient Prescriptions  Medication Sig Dispense Refill  . Calcium Citrate-Vitamin D (CITRACAL + D PO) Take by mouth.    . enalapril (VASOTEC) 5 MG tablet Take 5 mg by mouth daily.    Marland Kitchen gabapentin (NEURONTIN) 300 MG capsule Take 1 capsule (300 mg total) by mouth 2 (two) times daily. 60 capsule 5  . tamoxifen (NOLVADEX) 20 MG tablet Take 1 tablet (20 mg total) by mouth daily. 90 tablet 3   No current facility-administered medications for this visit.    OBJECTIVE: Middle-aged white woman who appears well Filed Vitals:   11/11/14 0931  BP: 140/85  Pulse: 70  Temp: 97.3 F (36.3 C)  Resp: 18     Body mass index is 25.32 kg/(m^2).    ECOG FS:1 - Symptomatic but completely ambulatory  Skin: warm, dry  HEENT: sclerae anicteric, conjunctivae pink, oropharynx clear. No thrush or mucositis.  Lymph Nodes: No cervical or supraclavicular lymphadenopathy  Lungs: clear to auscultation bilaterally, no rales, wheezes, or rhonci  Heart: regular rate and rhythm  Abdomen: round, soft, non tender, positive bowel sounds  Musculoskeletal: No focal spinal tenderness, no peripheral edema  Neuro: non focal, well oriented, positive affect    Breasts: left breast status post lumpectomy and radiation. No evidence of recurrent disease. Left axilla benign, but there is postop irregularity behind the incision where lymph nodes were removed. Right breast unremarkable.  LAB RESULTS:  CMP     Component Value Date/Time   NA 142 01/19/2014 1411   K 3.9 01/19/2014 1411   CO2 24 01/19/2014 1411   GLUCOSE 108 01/19/2014 1411   BUN 13.9 01/19/2014 1411   CREATININE 0.9 01/19/2014 1411   CALCIUM 9.2 01/19/2014 1411   PROT 6.8 01/19/2014 1411   ALBUMIN 4.0 01/19/2014 1411   AST 21 01/19/2014 1411   ALT 23 01/19/2014 1411   ALKPHOS 50 01/19/2014 1411   BILITOT 0.30 01/19/2014 1411    INo results found for: SPEP, UPEP  Lab Results  Component Value Date   WBC 6.6 01/19/2014   NEUTROABS 4.1  01/19/2014   HGB 14.1 01/19/2014   HCT 40.4 01/19/2014   MCV 88.6 01/19/2014   PLT 237 01/19/2014      Chemistry      Component Value Date/Time   NA 142 01/19/2014 1411   K 3.9 01/19/2014 1411   CO2 24 01/19/2014 1411   BUN 13.9 01/19/2014 1411   CREATININE 0.9 01/19/2014 1411      Component Value Date/Time   CALCIUM 9.2 01/19/2014 1411   ALKPHOS 50 01/19/2014 1411   AST 21 01/19/2014 1411   ALT 23 01/19/2014 1411   BILITOT 0.30 01/19/2014 1411       No results found for: LABCA2  No components found for: LABCA125  No results for input(s): INR in the last 168 hours.  Urinalysis No results found for: COLORURINE, APPEARANCEUR, LABSPEC, PHURINE, GLUCOSEU, HGBUR, BILIRUBINUR, KETONESUR, PROTEINUR, UROBILINOGEN, NITRITE, LEUKOCYTESUR  STUDIES: Mr Breast Bilateral W Wo Contrast  10/13/2014  CLINICAL DATA:  History of left lumpectomy 2014. Question of new lesion in the lumpectomy site recently identified on mammogram and ultrasound. LABS:  Creatinine were obtained on site at [place] at [address] Results: Creatinine 0.9 mg/dL.  GFR 65 EXAM: BILATERAL BREAST MRI WITH AND WITHOUT CONTRAST TECHNIQUE:  Multiplanar, multisequence MR images of both breasts were obtained prior to and following the intravenous administration of 14 ml of MultiHance. THREE-DIMENSIONAL MR IMAGE RENDERING ON INDEPENDENT WORKSTATION: Three-dimensional MR images were rendered by post-processing of the original MR data on an independent workstation. The three-dimensional MR images were interpreted, and findings are reported in the following complete MRI report for this study. Three dimensional images were evaluated at the independent DynaCad workstation COMPARISON:  MRI and 10/15/2012, mammogram and ultrasound from the [facility] 09/28/2014 FINDINGS: Breast composition: d. Extreme fibroglandular tissue. Background parenchymal enhancement: Mild Right breast: No mass or abnormal enhancement. Left breast: Postoperative changes are identified in the lateral portion of the left breast. Signal voids are identified consistent with clips as seen on recent mammographic evaluation. The lateral aspect of the left breast, posterior to the lumpectomy site there is an 8 mm nodule. Nodule is high signal intensity on T1 and T2 weighted images and shows enhancement postcontrast. Central lucency is identified. The appearance is stable when compared with previous MRI and previous mammogram studies. Findings are consistent with benign intramammary lymph node. No suspicious enhancing abnormalities within the left breast. Lymph nodes: No abnormal appearing lymph nodes. Ancillary findings:  None. IMPRESSION: 1. Expected postoperative changes in the left breast. 2. No suspicious abnormalities identified to indicate recurrent malignancy. 3. Stable, benign intramammary lymph node in the lateral portion of the left breast. RECOMMENDATION: Per previous recommendation, follow-up left diagnostic mammogram is recommended in 6 months. BI-RADS CATEGORY  2: Benign. Electronically Signed   By: Nolon Nations M.D.   On: 10/13/2014 10:50     ASSESSMENT: 54 y.o. [place] woman status post left breast biopsy 10/21/2012 for a clinical T1c N0 invasive ductal carcinoma, grade 1, estrogen receptor and progesterone receptor both at 100% positive with strong staining intensity, with an MIB-1 of 10%, and no HER-2 amplification, the signals ratio being 1.18. ([id number])  (1) biopsy of a suspicious left axillary lymph node 10/21/2012 was negative  (2) status post left lumpectomy and sentinel lymph node sampling 11/19/2012 for a pT1c pN0, stage IA invasive ductal carcinoma, grade 1, with negative margins. Repeat HER-2 was again negative (SZA 14 - 4617)  (3) Oncotype DX score of 9 predicts a  risk of recurrence outside the breast within the next 10 years of 7% if the patient's only systemic treatment is tamoxifen for 5 years. It also predicts no benefit from chemotherapy.  (4) radiation therapy completed in [facility] January 2015  (5) tamoxifen started 03/20/2013.  (a) switched to anastrozole July 2015 (but never started)  (b) estradiol level of 41 and FSH 22.8 noted 07/09/2013; goserelin started 08/22/2013, discontinued 11/14/2013  (c) tamoxifen resumed October 2015  PLAN: Jolyne, her mother, and I reviewed the findings of her breast MRI. No seroma was identified presently. It is possible her left arm discomfort is arising from a mild form of lymphedema. She has a sleeve at home but does not wear it regularly. I have asked her to give it a try for a weeks to see if this makes a difference. She has good range of motion in this arm fortunately.   I have asked her to resume her tamoxifen, and she is now willing given the cooler temperatures. We discussed her hot flashes, and she is willing to try gabapentin 358m QHS starting out to see if this makes a difference. We reviewed the fact that this medicine may make her drowsy. If it does not, she will add a dose to the morning as well. This may also help with the nerve like pain she  is experiencing to her left arm.   PParrisis to have a repeat left mammogram in March. She will return in see uKoreain April for follow up. She understands and agrees with this plan. She knows the goal of treatment in her case is cure. She has been encouraged to call with any issues that might arise before her next visit here.   HLaurie Panda NP   11/11/2014 10:14 AM

## 2014-11-11 NOTE — Telephone Encounter (Signed)
Appointments made and avs printed for patient °

## 2014-11-19 ENCOUNTER — Other Ambulatory Visit: Payer: BC Managed Care – PPO

## 2014-11-19 ENCOUNTER — Ambulatory Visit: Payer: BC Managed Care – PPO | Admitting: Oncology

## 2015-01-17 NOTE — Progress Notes (Signed)
This encounter was created in error - please disregard.  This encounter was created in error - please disregard.

## 2015-03-08 ENCOUNTER — Other Ambulatory Visit: Payer: Self-pay | Admitting: Oncology

## 2015-03-08 DIAGNOSIS — Z9889 Other specified postprocedural states: Secondary | ICD-10-CM

## 2015-03-19 ENCOUNTER — Ambulatory Visit
Admission: RE | Admit: 2015-03-19 | Discharge: 2015-03-19 | Disposition: A | Discharge status: Home / Self Care | Payer: Managed Care, Other (non HMO) | Source: Ambulatory Visit | Attending: Oncology | Admitting: Oncology

## 2015-03-19 DIAGNOSIS — Z9889 Other specified postprocedural states: Secondary | ICD-10-CM

## 2015-05-21 ENCOUNTER — Other Ambulatory Visit: Payer: Self-pay

## 2015-05-21 DIAGNOSIS — C50412 Malignant neoplasm of upper-outer quadrant of left female breast: Secondary | ICD-10-CM

## 2015-05-21 DIAGNOSIS — R232 Flushing: Secondary | ICD-10-CM

## 2015-05-24 ENCOUNTER — Telehealth: Payer: Self-pay | Admitting: Oncology

## 2015-05-24 ENCOUNTER — Other Ambulatory Visit (HOSPITAL_BASED_OUTPATIENT_CLINIC_OR_DEPARTMENT_OTHER): Payer: Managed Care, Other (non HMO)

## 2015-05-24 ENCOUNTER — Ambulatory Visit (HOSPITAL_BASED_OUTPATIENT_CLINIC_OR_DEPARTMENT_OTHER): Payer: Managed Care, Other (non HMO) | Admitting: Oncology

## 2015-05-24 VITALS — BP 145/85 | HR 67 | Temp 97.5°F | Resp 18 | Wt 147.7 lb

## 2015-05-24 DIAGNOSIS — C50412 Malignant neoplasm of upper-outer quadrant of left female breast: Secondary | ICD-10-CM | POA: Diagnosis not present

## 2015-05-24 DIAGNOSIS — N951 Menopausal and female climacteric states: Secondary | ICD-10-CM

## 2015-05-24 DIAGNOSIS — Z79811 Long term (current) use of aromatase inhibitors: Secondary | ICD-10-CM

## 2015-05-24 DIAGNOSIS — R232 Flushing: Secondary | ICD-10-CM

## 2015-05-24 LAB — COMPREHENSIVE METABOLIC PANEL
ALT: 20 U/L (ref 0–55)
AST: 21 U/L (ref 5–34)
Albumin: 4 g/dL (ref 3.5–5.0)
Alkaline Phosphatase: 44 U/L (ref 40–150)
Anion Gap: 8 mEq/L (ref 3–11)
BILIRUBIN TOTAL: 0.44 mg/dL (ref 0.20–1.20)
BUN: 12 mg/dL (ref 7.0–26.0)
CALCIUM: 9.9 mg/dL (ref 8.4–10.4)
CHLORIDE: 107 meq/L (ref 98–109)
CO2: 25 meq/L (ref 22–29)
CREATININE: 0.8 mg/dL (ref 0.6–1.1)
EGFR: 82 mL/min/{1.73_m2} — ABNORMAL LOW (ref >90)
Glucose: 78 mg/dl (ref 70–140)
Potassium: 4 mEq/L (ref 3.5–5.1)
Sodium: 140 mEq/L (ref 136–145)
TOTAL PROTEIN: 6.8 g/dL (ref 6.4–8.3)

## 2015-05-24 LAB — CBC WITH DIFFERENTIAL/PLATELET
BASO%: 0.5 % (ref 0.0–2.0)
Basophils Absolute: 0 10*3/uL (ref 0.0–0.1)
EOS%: 1.3 % (ref 0.0–7.0)
Eosinophils Absolute: 0.1 10*3/uL (ref 0.0–0.5)
HEMATOCRIT: 42.5 % (ref 34.8–46.6)
HGB: 14.1 g/dL (ref 11.6–15.9)
LYMPH#: 2.1 10*3/uL (ref 0.9–3.3)
LYMPH%: 35.1 % (ref 14.0–49.7)
MCH: 30 pg (ref 25.1–34.0)
MCHC: 33.3 g/dL (ref 31.5–36.0)
MCV: 90.3 fL (ref 79.5–101.0)
MONO#: 0.3 10*3/uL (ref 0.1–0.9)
MONO%: 5.5 % (ref 0.0–14.0)
NEUT%: 57.6 % (ref 38.4–76.8)
NEUTROS ABS: 3.5 10*3/uL (ref 1.5–6.5)
PLATELETS: 232 10*3/uL (ref 145–400)
RBC: 4.7 10*6/uL (ref 3.70–5.45)
RDW: 12.9 % (ref 11.2–14.5)
WBC: 6.1 10*3/uL (ref 3.9–10.3)

## 2015-05-24 NOTE — Telephone Encounter (Signed)
appt made and avs printed °

## 2015-05-24 NOTE — Progress Notes (Signed)
[place]  Telephone:(336) [phone] Fax:(336) [phone]     ID: Margaret Reynolds DOB: [birth year]  MR#: [id number]  PHX#:[id number]  Patient Care Team: Margaret Sheriff, MD as PCP - General (Family Medicine) PCP: Margaret Reynolds., MD GYN: SU: Excell Seltzer OTHER MD:  CHIEF COMPLAINT: Estrogen receptor positive breast cancer  CURRENT TREATMENT: Tamoxifen   BREAST CANCER HISTORY: From Dr Bernell List Khan's intake note 11/18/2012:  "Margaret Reynolds is a 55 y.o. female. With family history significant for mother with breast cancer at the age of 50. Patient has a history of dense breast she's been having high-risk mammography performed as well as MRIs. Recently she had an MRI of the breasts performed in the left breast at the 2:00 position there was noted to be a 1.9 cm mass nodes were within the left axilla were normal. She had an ultrasound performed and this confirmed at the 2:00 position a 1.2 cm area of concern. She underwent a biopsy. This showed a invasive ductal carcinoma grade 1 low grade node was negative. There was associated DCIS. Tumor was ER positive PR positive HER-2/neu negative with a proliferation marker Ki-67 of only 10%. Patient's case was discussed at the multidisciplinary breast conference her pathology and radiology were reviewed. Treatment plan was made. Patient was seen also by Dr. Arloa Koh and Dr. Neldon Mc. Patient herself is without any significant complaints."  Her subsequent history is as detailed below.  INTERVAL HISTORY: Margaret Reynolds returns today for follow-up of her estrogen receptor positive breast cancer, accompanied by her mother Margaret Reynolds. Marco continues on tamoxifen, which she tolerates generally well. She still does have hot flashes but "I can live with them". We tried Neurontin for that, but it made her sick and she stopped the medication. She is currently obtaining the tamoxifen at no cost  REVIEW OF SYSTEMS: She has pain, which  she describes as soreness, in the surgical axilla. This is a "sore spot" in more ways and 1, since she feels that she had had a drain for her seroma she wouldn't be hurting and also she tells me they made her weight 2 weeks before the seroma was finally drained when she was walking with her arms spivey'd out of the swelling.this a detailed review of systems today was noncontributory  PAST MEDICAL HISTORY: Past Medical History  Diagnosis Date  . Breast cancer ([facility])   . Hypertension   . Arthritis   . Contact lens/glasses fitting     wears contacts or glasses    PAST SURGICAL HISTORY: Past Surgical History  Procedure Laterality Date  . Breast lumpectomy  0165,5374    lt br,rt   . Minor fulgeration of anal condyloma  2007  . Cesarean section    . Bunionectomy  2013    right foot  . Colonoscopy    . Breast lumpectomy with needle localization and axillary sentinel lymph node bx Left 11/19/2012    Procedure: BREAST LUMPECTOMY WITH NEEDLE LOCALIZATION AND AXILLARY SENTINEL LYMPH NODE BIOPSY;  Surgeon: Haywood Lasso, MD;  Location: [facility];  Service: General;  Laterality: Left;    FAMILY HISTORY Family History  Problem Relation Age of Onset  . Breast cancer Mother 51  . Melanoma Father 57  . Cervical cancer Maternal Grandmother 73  . Cancer Paternal Uncle     Cancer NOS   the patient's father died from a heart attack at the age of 21. He had a history of melanoma, for which  he received interleukin, and also myasthenia gravis. The patient's mother is living. She was diagnosed with breast cancer in her 74s. The patient had no sisters.She has 2 brothers. There is no other history of breast or ovarian cancer in the family to her knowledge  GYNECOLOGIC HISTORY:  No LMP recorded. Patient is postmenopausal. Menarche age 34, first live birth at 23. She is GX P2. She stopped having periods several years ago, when she had an implant placed. She had the implant removed she  thinks 2 or 3 years ago and her periods did not resume after that. She received Zoladex here monthly beginning mid-July 2015 and stopping 11/14/2013. She noted little difference when going off Zoladex.   SOCIAL HISTORY:  The patient works as an Public affairs consultant. Her husband , Margaret Reynolds, is a Administrator. Son Margaret Reynolds works in the [employer]. Son Margaret Reynolds works in Information systems manager. The patient has 1 grandchild. She is a Tourist information centre manager    ADVANCED DIRECTIVES: Not in place   HEALTH MAINTENANCE: Social History  Substance Use Topics  . Smoking status: Former Smoker -- 0.30 packs/day for 30 years    Quit date: 11/14/2008  . Smokeless tobacco: Not on file  . Alcohol Use: 4.2 oz/week    7 Cans of beer per week     Comment: occ     Colonoscopy: At age 49/Dr. Melina Copa  PAP:  Bone density:  Lipid panel:  No Known Allergies  Current Outpatient Prescriptions  Medication Sig Dispense Refill  . Calcium Citrate-Vitamin D (CITRACAL + D PO) Take by mouth.    . enalapril (VASOTEC) 5 MG tablet Take 5 mg by mouth daily.    Marland Kitchen gabapentin (NEURONTIN) 300 MG capsule Take 1 capsule (300 mg total) by mouth 2 (two) times daily. 60 capsule 5  . tamoxifen (NOLVADEX) 20 MG tablet Take 1 tablet (20 mg total) by mouth daily. 90 tablet 3   No current facility-administered medications for this visit.    OBJECTIVE: Middle-aged white woman In no acute distress Filed Vitals:   05/24/15 1127  BP: 145/85  Pulse: 67  Temp: 97.5 F (36.4 C)  Resp: 18     Body mass index is 25.34 kg/(m^2).    ECOG FS:0 - Asymptomatic  Sclerae unicteric, pupils round and equal Oropharynx clear and moist-- no thrush or other lesions No cervical or supraclavicular adenopathy Lungs no rales or rhonchi Heart regular rate and rhythm Abd soft, nontender, positive bowel sounds MSK no focal spinal tenderness, no upper extremity lymphedema Neuro: nonfocal, well oriented, appropriate affect Breasts: The right breast is unremarkable.  The left breast is status post lumpectomy and radiation. I do not palpate an obvious seroma in the left axilla anymore. The incision has healed nicely. There are no suspicious masses in the left breast and no skin or nipple changes of concern  LAB RESULTS:  CMP     Component Value Date/Time   NA 142 01/19/2014 1411   K 3.9 01/19/2014 1411   CO2 24 01/19/2014 1411   GLUCOSE 108 01/19/2014 1411   BUN 13.9 01/19/2014 1411   CREATININE 0.9 01/19/2014 1411   CALCIUM 9.2 01/19/2014 1411   PROT 6.8 01/19/2014 1411   ALBUMIN 4.0 01/19/2014 1411   AST 21 01/19/2014 1411   ALT 23 01/19/2014 1411   ALKPHOS 50 01/19/2014 1411   BILITOT 0.30 01/19/2014 1411    INo results found for: SPEP, UPEP  Lab Results  Component Value Date   WBC 6.1 05/24/2015  NEUTROABS 3.5 05/24/2015   HGB 14.1 05/24/2015   HCT 42.5 05/24/2015   MCV 90.3 05/24/2015   PLT 232 05/24/2015      Chemistry      Component Value Date/Time   NA 142 01/19/2014 1411   K 3.9 01/19/2014 1411   CO2 24 01/19/2014 1411   BUN 13.9 01/19/2014 1411   CREATININE 0.9 01/19/2014 1411      Component Value Date/Time   CALCIUM 9.2 01/19/2014 1411   ALKPHOS 50 01/19/2014 1411   AST 21 01/19/2014 1411   ALT 23 01/19/2014 1411   BILITOT 0.30 01/19/2014 1411       No results found for: LABCA2  No components found for: LABCA125  No results for input(s): INR in the last 168 hours.  Urinalysis No results found for: COLORURINE, APPEARANCEUR, LABSPEC, PHURINE, GLUCOSEU, HGBUR, BILIRUBINUR, KETONESUR, PROTEINUR, UROBILINOGEN, NITRITE, LEUKOCYTESUR  STUDIES: CLINICAL DATA: 55 year old female -followup asymmetry along the left lumpectomy site. History of left breast cancer and lumpectomy in 2014.  EXAM: DIGITAL DIAGNOSTIC LEFT MAMMOGRAM WITH 3D TOMOSYNTHESIS AND CAD  COMPARISON: 09/28/2014 and prior exams. 10/12/2014 MR.  ACR Breast Density Category c: The breast tissue is heterogeneously dense, which may obscure  small masses.  FINDINGS: 2D and 3D full field views of the left breast again demonstrate postsurgical changes within the upper outer left breast.  The questioned asymmetry on the prior study is much less conspicuous.  No suspicious mass, nonsurgical distortion or worrisome calcifications are identified.  Mammographic images were processed with CAD.  IMPRESSION: No suspicious abnormalities identified. The previously described left breast asymmetry is much less conspicuous.  Left breast scarring.  RECOMMENDATION: Bilateral diagnostic mammograms in 6 months to resume annual mammogram schedule.  I have discussed the findings and recommendations with the patient. Results were also provided in writing at the conclusion of the visit. If applicable, a reminder letter will be sent to the patient regarding the next appointment.  BI-RADS CATEGORY 2: Benign.   Electronically Signed  By: Margarette Canada M.D.  On: 03/19/2015 11:19  ASSESSMENT: 55 y.o. [place] woman status post left breast biopsy 10/21/2012 for a clinical T1c N0 invasive ductal carcinoma, grade 1, estrogen receptor and progesterone receptor both at 100% positive with strong staining intensity, with an MIB-1 of 10%, and no HER-2 amplification, the signals ratio being 1.18. ([id number])  (1) biopsy of a suspicious left axillary lymph node 10/21/2012 was negative  (2) status post left lumpectomy and sentinel lymph node sampling 11/19/2012 for a pT1c pN0, stage IA invasive ductal carcinoma, grade 1, with negative margins. Repeat HER-2 was again negative (SZA 14 - 4617)  (3) Oncotype DX score of 9 predicts a risk of recurrence outside the breast within the next 10 years of 7% if the patient's only systemic treatment is tamoxifen for 5 years. It also predicts no benefit from chemotherapy.  (4) radiation therapy completed in [place] January 2015  (5) tamoxifen started 03/20/2013.  (a) switched to anastrozole  July 2015 (but never started)  (b) estradiol level of 41 and FSH 22.8 noted 07/09/2013; goserelin started 08/22/2013, discontinued 11/14/2013  (c) tamoxifen resumed October 2015  PLAN: Joliene remains concerned about her left axilla. Soreness in that area is not at all,. It could be that the seroma contributed to it but many patients don't have seromas and still have that. In any case I think she could take Aleve for this. This is generally well-tolerated and in my experience can significantly relieve that type of problem.  She is tolerating the tamoxifen well and is willing to continue it to a minimum of 5 years.  She just saw Dr. Excell Seltzer within a month and we'll see him again a year from now. Accordingly she will return to see me in 6 months and I will start seeing her yearly from that visit.  She knows to call for any problems that may develop before then.   Chauncey Cruel, MD   05/24/2015 11:32 AM

## 2015-05-28 ENCOUNTER — Encounter: Payer: Self-pay | Admitting: Oncology

## 2015-05-31 ENCOUNTER — Other Ambulatory Visit: Payer: Self-pay | Admitting: *Deleted

## 2015-05-31 DIAGNOSIS — C50412 Malignant neoplasm of upper-outer quadrant of left female breast: Secondary | ICD-10-CM

## 2015-06-07 ENCOUNTER — Other Ambulatory Visit: Payer: Self-pay | Admitting: *Deleted

## 2015-06-07 DIAGNOSIS — R52 Pain, unspecified: Secondary | ICD-10-CM

## 2015-06-07 DIAGNOSIS — C50412 Malignant neoplasm of upper-outer quadrant of left female breast: Secondary | ICD-10-CM

## 2015-06-14 ENCOUNTER — Other Ambulatory Visit: Payer: Self-pay | Admitting: *Deleted

## 2015-06-18 ENCOUNTER — Other Ambulatory Visit: Payer: Self-pay | Admitting: Oncology

## 2015-06-18 ENCOUNTER — Ambulatory Visit
Admission: RE | Admit: 2015-06-18 | Discharge: 2015-06-18 | Disposition: A | Discharge status: Home / Self Care | Payer: Managed Care, Other (non HMO) | Source: Ambulatory Visit | Attending: Oncology | Admitting: Oncology

## 2015-06-18 DIAGNOSIS — C50412 Malignant neoplasm of upper-outer quadrant of left female breast: Secondary | ICD-10-CM

## 2015-06-18 DIAGNOSIS — R52 Pain, unspecified: Secondary | ICD-10-CM

## 2015-06-18 DIAGNOSIS — R2232 Localized swelling, mass and lump, left upper limb: Secondary | ICD-10-CM

## 2015-06-18 DIAGNOSIS — N6320 Unspecified lump in the left breast, unspecified quadrant: Secondary | ICD-10-CM

## 2015-06-22 ENCOUNTER — Other Ambulatory Visit: Payer: Self-pay | Admitting: Oncology

## 2015-06-22 DIAGNOSIS — N6320 Unspecified lump in the left breast, unspecified quadrant: Secondary | ICD-10-CM

## 2015-06-23 ENCOUNTER — Ambulatory Visit
Admission: RE | Admit: 2015-06-23 | Discharge: 2015-06-23 | Disposition: A | Discharge status: Home / Self Care | Payer: Managed Care, Other (non HMO) | Source: Ambulatory Visit | Attending: Oncology | Admitting: Oncology

## 2015-06-23 DIAGNOSIS — N6320 Unspecified lump in the left breast, unspecified quadrant: Secondary | ICD-10-CM

## 2015-06-23 DIAGNOSIS — R2232 Localized swelling, mass and lump, left upper limb: Secondary | ICD-10-CM

## 2015-06-25 ENCOUNTER — Telehealth: Payer: Self-pay

## 2015-06-25 ENCOUNTER — Encounter: Payer: Self-pay | Admitting: Oncology

## 2015-06-25 ENCOUNTER — Other Ambulatory Visit: Payer: Self-pay | Admitting: Oncology

## 2015-06-25 NOTE — Telephone Encounter (Signed)
Called patient about pathology results being benign.  Patient states that she continues to have significant "pain in her arm and the knot under her arm is still there", she would like to know what MD plans to do with it.

## 2015-06-30 ENCOUNTER — Encounter: Payer: Self-pay | Admitting: Oncology

## 2015-07-07 ENCOUNTER — Encounter: Payer: Self-pay | Admitting: Oncology

## 2015-07-23 ENCOUNTER — Encounter: Payer: Self-pay | Admitting: Oncology

## 2015-08-16 ENCOUNTER — Other Ambulatory Visit: Payer: Self-pay | Admitting: Oncology

## 2015-08-16 DIAGNOSIS — N6320 Unspecified lump in the left breast, unspecified quadrant: Secondary | ICD-10-CM

## 2015-09-10 ENCOUNTER — Other Ambulatory Visit (HOSPITAL_COMMUNITY): Payer: Self-pay | Admitting: *Deleted

## 2015-09-10 DIAGNOSIS — Z853 Personal history of malignant neoplasm of breast: Secondary | ICD-10-CM

## 2015-09-13 ENCOUNTER — Other Ambulatory Visit (HOSPITAL_COMMUNITY): Payer: Self-pay | Admitting: *Deleted

## 2015-09-13 DIAGNOSIS — Z853 Personal history of malignant neoplasm of breast: Secondary | ICD-10-CM

## 2015-09-29 ENCOUNTER — Telehealth (HOSPITAL_COMMUNITY): Payer: Self-pay | Admitting: *Deleted

## 2015-09-29 NOTE — Telephone Encounter (Signed)
Telephoned patient at home number and phone kept ringing and then just hung up. Trying to remind patient of [facility] appointment for Thursday August 31 11:00

## 2015-09-30 ENCOUNTER — Ambulatory Visit (HOSPITAL_COMMUNITY): Payer: Managed Care, Other (non HMO)

## 2015-10-22 ENCOUNTER — Encounter (HOSPITAL_COMMUNITY): Payer: Self-pay

## 2015-10-22 ENCOUNTER — Ambulatory Visit (HOSPITAL_COMMUNITY)
Admission: RE | Admit: 2015-10-22 | Discharge: 2015-10-22 | Disposition: A | Discharge status: Home / Self Care | Payer: Self-pay | Source: Ambulatory Visit | Attending: Obstetrics and Gynecology | Admitting: Obstetrics and Gynecology

## 2015-10-22 ENCOUNTER — Ambulatory Visit
Admission: RE | Admit: 2015-10-22 | Discharge: 2015-10-22 | Disposition: A | Discharge status: Home / Self Care | Payer: Managed Care, Other (non HMO) | Source: Ambulatory Visit | Attending: Obstetrics and Gynecology | Admitting: Obstetrics and Gynecology

## 2015-10-22 VITALS — BP 104/60 | Temp 98.1°F | Ht 64.0 in | Wt 143.6 lb

## 2015-10-22 DIAGNOSIS — N644 Mastodynia: Secondary | ICD-10-CM

## 2015-10-22 DIAGNOSIS — Z853 Personal history of malignant neoplasm of breast: Secondary | ICD-10-CM

## 2015-10-22 DIAGNOSIS — Z1239 Encounter for other screening for malignant neoplasm of breast: Secondary | ICD-10-CM

## 2015-10-22 NOTE — Progress Notes (Signed)
Complaints of left axillary and outer breast pain x 3 years that comes and goes. Patient rates pain at a 5 out of 10.  Pap Smear: Pap smear not completed today. Last Pap smear was 10/13/2014 at 8 for Women and normal. Per patient has a history of multiple abnormal Pap smears that required repeat Pap smears for follow up. Patient denies every having a colposcopy, cryotherapy, or LEEP. Last two Pap smear results are in EPIC.  Physical exam: Breasts Breasts symmetrical. No skin abnormalities right breast. Scar observed left upper outer breast and axilla. No nipple retraction bilateral breasts. No nipple discharge bilateral breasts. No lymphadenopathy. No lumps palpated bilateral breasts. Complaints of left upper outer breast tenderness on exam. Referred patient to the [facility] for diagnostic mammogram and possible left breast ultrasound. Appointment scheduled for Friday, October 22, 2015 at 1500.        Pelvic/Bimanual No Pap smear completed today since last Pap smear was 10/13/2014. Pap smear not indicated per BCCCP guidelines.   Smoking History: Patient has never smoked.  Patient Navigation: Patient education provided. Access to services provided for patient through [facility] program.   Colorectal Cancer Screening: Patient had a colonoscopy completed 5 years ago when she was 55 years old. No complaints today.

## 2015-10-22 NOTE — Patient Instructions (Signed)
Explained breast self awareness with [name]. Patient did not need a Pap smear today due to last Pap smear was 10/13/2014. Let her know BCCCP will cover Pap smears every 3 years unless has a history of abnormal Pap smears. Referred patient to the [facility] for diagnostic mammogram and possible left breast ultrasound. Appointment scheduled for Friday, October 22, 2015 at 1500. [name] Yorkverbalized understanding.  Adaline Trejos, Arvil Chaco, RN 3:10 PM

## 2015-10-26 ENCOUNTER — Encounter (HOSPITAL_COMMUNITY): Payer: Self-pay | Admitting: *Deleted

## 2015-11-22 ENCOUNTER — Other Ambulatory Visit: Payer: Self-pay | Admitting: *Deleted

## 2015-11-22 DIAGNOSIS — C50412 Malignant neoplasm of upper-outer quadrant of left female breast: Secondary | ICD-10-CM

## 2015-11-23 ENCOUNTER — Ambulatory Visit (HOSPITAL_BASED_OUTPATIENT_CLINIC_OR_DEPARTMENT_OTHER): Payer: Self-pay | Admitting: Oncology

## 2015-11-23 ENCOUNTER — Other Ambulatory Visit (HOSPITAL_BASED_OUTPATIENT_CLINIC_OR_DEPARTMENT_OTHER): Payer: Self-pay

## 2015-11-23 VITALS — BP 136/73 | HR 89 | Temp 98.2°F | Resp 18 | Ht 64.0 in | Wt 144.0 lb

## 2015-11-23 DIAGNOSIS — Z170 Estrogen receptor positive status [ER+]: Secondary | ICD-10-CM

## 2015-11-23 DIAGNOSIS — C50412 Malignant neoplasm of upper-outer quadrant of left female breast: Secondary | ICD-10-CM

## 2015-11-23 DIAGNOSIS — Z79811 Long term (current) use of aromatase inhibitors: Secondary | ICD-10-CM

## 2015-11-23 LAB — CBC WITH DIFFERENTIAL/PLATELET
BASO%: 0.6 % (ref 0.0–2.0)
Basophils Absolute: 0 10*3/uL (ref 0.0–0.1)
EOS%: 0.5 % (ref 0.0–7.0)
Eosinophils Absolute: 0 10*3/uL (ref 0.0–0.5)
HCT: 42.5 % (ref 34.8–46.6)
HGB: 14.4 g/dL (ref 11.6–15.9)
LYMPH%: 25.3 % (ref 14.0–49.7)
MCH: 30.7 pg (ref 25.1–34.0)
MCHC: 33.8 g/dL (ref 31.5–36.0)
MCV: 90.6 fL (ref 79.5–101.0)
MONO#: 0.3 10*3/uL (ref 0.1–0.9)
MONO%: 4.2 % (ref 0.0–14.0)
NEUT%: 69.4 % (ref 38.4–76.8)
NEUTROS ABS: 5.1 10*3/uL (ref 1.5–6.5)
PLATELETS: 264 10*3/uL (ref 145–400)
RBC: 4.69 10*6/uL (ref 3.70–5.45)
RDW: 12.5 % (ref 11.2–14.5)
WBC: 7.4 10*3/uL (ref 3.9–10.3)
lymph#: 1.9 10*3/uL (ref 0.9–3.3)

## 2015-11-23 LAB — COMPREHENSIVE METABOLIC PANEL
ALT: 19 U/L (ref 0–55)
ANION GAP: 10 meq/L (ref 3–11)
AST: 18 U/L (ref 5–34)
Albumin: 4.1 g/dL (ref 3.5–5.0)
Alkaline Phosphatase: 62 U/L (ref 40–150)
BUN: 11.3 mg/dL (ref 7.0–26.0)
CHLORIDE: 108 meq/L (ref 98–109)
CO2: 23 meq/L (ref 22–29)
Calcium: 9.8 mg/dL (ref 8.4–10.4)
Creatinine: 0.8 mg/dL (ref 0.6–1.1)
EGFR: 87 mL/min/{1.73_m2} — AB (ref >90)
GLUCOSE: 108 mg/dL (ref 70–140)
Potassium: 4 mEq/L (ref 3.5–5.1)
SODIUM: 141 meq/L (ref 136–145)
Total Bilirubin: 0.48 mg/dL (ref 0.20–1.20)
Total Protein: 6.9 g/dL (ref 6.4–8.3)

## 2015-11-23 NOTE — Progress Notes (Signed)
[facility]  [phone] [phone]     ID: Margaret Reynolds DOB: [birth year]  MR#: [id number]  WOE#:[id number]  Patient Care Team: Margaret Sheriff, MD as PCP - General (Family Medicine) PCP: Margaret Reynolds., MD GYN: SU: Margaret Reynolds OTHER MD:  CHIEF COMPLAINT: Estrogen receptor positive breast cancer  CURRENT TREATMENT: Tamoxifen   BREAST CANCER HISTORY: From Dr Margaret Reynolds's intake note 11/18/2012:  "Margaret Reynolds is a 55 y.o. female. With family history significant for mother with breast cancer at the age of 22. Patient has a history of dense breast she's been having high-risk mammography performed as well as MRIs. Recently she had an MRI of the breasts performed in the left breast at the 2:00 position there was noted to be a 1.9 cm mass nodes were within the left axilla were normal. She had an ultrasound performed and this confirmed at the 2:00 position a 1.2 cm area of concern. She underwent a biopsy. This showed a invasive ductal carcinoma grade 1 low grade node was negative. There was associated DCIS. Tumor was ER positive PR positive HER-2/neu negative with a proliferation marker Ki-67 of only 10%. Patient's case was discussed at the multidisciplinary breast conference her pathology and radiology were reviewed. Treatment plan was made. Patient was seen also by Dr. Arloa Reynolds and Dr. Neldon Reynolds. Patient herself is without any significant complaints."  Her subsequent history is as detailed below.  INTERVAL HISTORY: Margaret Reynolds returns today for follow-up of her left-sided breast cancer, accompanied by her mother Margaret Reynolds. Margaret Reynolds  Takes tamoxifen daily, generally with good tolerance. She does have hot flashes which are "up and down". She obtains a drug at a good price.  REVIEW OF SYSTEMS: Margaret Reynolds remains concerned about the lump in her left armpit. It does cause her discomfort. This was evaluated last May and was felt to be benign.  Aside from that issue a detailed review of systems today was stable  PAST MEDICAL HISTORY: Past Medical History:  Diagnosis Date  . Arthritis   . Breast cancer ([facility])   . Contact lens/glasses fitting    wears contacts or glasses  . Hypertension     PAST SURGICAL HISTORY: Past Surgical History:  Procedure Laterality Date  . BREAST LUMPECTOMY  [id number]   lt br,rt   . BREAST LUMPECTOMY WITH NEEDLE LOCALIZATION AND AXILLARY SENTINEL LYMPH NODE BX Left 11/19/2012   Procedure: BREAST LUMPECTOMY WITH NEEDLE LOCALIZATION AND AXILLARY SENTINEL LYMPH NODE BIOPSY;  Surgeon: Margaret Lasso, MD;  Location: [facility];  Service: General;  Laterality: Left;  . BUNIONECTOMY  2013   right foot  . CESAREAN SECTION    . COLONOSCOPY    . MINOR FULGERATION OF ANAL CONDYLOMA  2007    FAMILY HISTORY Family History  Problem Relation Age of Onset  . Breast cancer Mother 87  . Hypertension Mother   . Diabetes Mother   . Melanoma Father 82  . Hypertension Father   . Diabetes Father   . Cervical cancer Maternal Grandmother 36  . Cancer Paternal Uncle     Cancer NOS  . Diabetes Paternal Uncle    the patient's father died from a heart attack at the age of 29. He had a history of melanoma, for which he received interleukin, and also myasthenia gravis. The patient's mother is living. She was diagnosed with breast cancer in her 64s. The patient had no sisters.She has 2 brothers. There is no other history of  breast or ovarian cancer in the family to her knowledge  GYNECOLOGIC HISTORY:  No LMP recorded. Patient is postmenopausal. Menarche age 66, first live birth at 32. She is GX P2. She stopped having periods several years ago, when she had an implant placed. She had the implant removed she thinks 2 or 3 years ago and her periods did not resume after that. She received Zoladex here monthly beginning mid-July 2015 and stopping 11/14/2013. She noted little difference when going off Zoladex.    SOCIAL HISTORY:  The patient works as an Public affairs consultant. Her husband , Margaret Reynolds, is a Administrator. Son Margaret Reynolds works in the [employer]. Son Margaret Reynolds works in Information systems manager. The patient has 1 grandchild. She is a Tourist information centre manager    ADVANCED DIRECTIVES: Not in place   HEALTH MAINTENANCE: Social History  Substance Use Topics  . Smoking status: Former Smoker    Packs/day: 0.30    Years: 30.00    Quit date: 11/14/2008  . Smokeless tobacco: Never Used  . Alcohol use 4.2 oz/week    7 Cans of beer per week     Comment: occ     Colonoscopy: At age 48/Dr. Melina Reynolds  PAP:  Bone density:  Lipid panel:  No Known Allergies  Current Outpatient Prescriptions  Medication Sig Dispense Refill  . Calcium Citrate-Vitamin D (CITRACAL + D PO) Take by mouth.    . enalapril (VASOTEC) 5 MG tablet Take 5 mg by mouth daily.    . tamoxifen (NOLVADEX) 20 MG tablet Take 1 tablet (20 mg total) by mouth daily. 90 tablet 3   No current facility-administered medications for this visit.     OBJECTIVE: Middle-aged white woman Who appears well Vitals:   11/23/15 1324  BP: 136/73  Pulse: 89  Resp: 18  Temp: 98.2 F (36.8 C)     Body mass index is 24.72 kg/m.    ECOG FS:0 - Asymptomatic  Sclerae unicteric, EOMs intact Oropharynx clear and moist No cervical or supraclavicular adenopathy Lungs no rales or rhonchi Heart regular rate and rhythm Abd soft, nontender, positive bowel sounds MSK no focal spinal tenderness, no upper extremity lymphedema Neuro: nonfocal, well oriented, appropriate affect Breasts: The right breast is unremarkable. The left breast is status post lumpectomy and radiation. There is no evidence of local recurrence. The left axilla just lateral to the superior aspect of the incision, there is a 1 cm mass which was previously evaluated.   LAB RESULTS:  CMP     Component Value Date/Time   NA 140 05/24/2015 1114   K 4.0 05/24/2015 1114   CO2 25 05/24/2015 1114   GLUCOSE  78 05/24/2015 1114   BUN 12.0 05/24/2015 1114   CREATININE 0.8 05/24/2015 1114   CALCIUM 9.9 05/24/2015 1114   PROT 6.8 05/24/2015 1114   ALBUMIN 4.0 05/24/2015 1114   AST 21 05/24/2015 1114   ALT 20 05/24/2015 1114   ALKPHOS 44 05/24/2015 1114   BILITOT 0.44 05/24/2015 1114    INo results found for: SPEP, UPEP  Lab Results  Component Value Date   WBC 7.4 11/23/2015   NEUTROABS 5.1 11/23/2015   HGB 14.4 11/23/2015   HCT 42.5 11/23/2015   MCV 90.6 11/23/2015   PLT 264 11/23/2015      Chemistry      Component Value Date/Time   NA 140 05/24/2015 1114   K 4.0 05/24/2015 1114   CO2 25 05/24/2015 1114   BUN 12.0 05/24/2015 1114   CREATININE 0.8 05/24/2015  1114      Component Value Date/Time   CALCIUM 9.9 05/24/2015 1114   ALKPHOS 44 05/24/2015 1114   AST 21 05/24/2015 1114   ALT 20 05/24/2015 1114   BILITOT 0.44 05/24/2015 1114       No results found for: LABCA2  No components found for: LABCA125  No results for input(s): INR in the last 168 hours.  Urinalysis No results found for: COLORURINE, APPEARANCEUR, LABSPEC, PHURINE, GLUCOSEU, HGBUR, BILIRUBINUR, KETONESUR, PROTEINUR, UROBILINOGEN, NITRITE, LEUKOCYTESUR  STUDIES: 2D DIGITAL DIAGNOSTIC BILATERAL MAMMOGRAM WITH CAD AND ADJUNCT TOMO  COMPARISON:  Previous exam(s).  ACR Breast Density Category c: The breast tissue is heterogeneously dense, which may obscure small masses.  FINDINGS: The lumpectomy site in the upper-outer quadrant of the left breast is stable. No suspicious calcifications, masses or areas of distortion are seen in the bilateral breasts.  Mammographic images were processed with CAD.  IMPRESSION: Stable left breast lumpectomy site. No mammographic evidence of malignancy in the bilateral breasts.  RECOMMENDATION: Diagnostic mammogram is suggested in 1 year. (Code:DM-B-01Y)  I have discussed the findings and recommendations with the patient. Results were also provided in  writing at the conclusion of the visit. If applicable, a reminder letter will be sent to the patient regarding the next appointment.  BI-RADS CATEGORY  2: Benign.   Electronically Signed   By: Ammie Ferrier M.D.   On: 10/22/2015 16:17  ASSESSMENT: 55 y.o. [place] woman status post left upper outer quadrant breast biopsy 10/21/2012 for a clinical T1c N0 invasive ductal carcinoma, grade 1, estrogen receptor and progesterone receptor both at 100% positive with strong staining intensity, with an MIB-1 of 10%, and no HER-2 amplification, the signals ratio being 1.18. ([id number])  (1) biopsy of a suspicious left axillary lymph node 10/21/2012 was negative  (2) status post left lumpectomy and sentinel lymph node sampling 11/19/2012 for a pT1c pN0, stage IA invasive ductal carcinoma, grade 1, with negative margins. Repeat HER-2 was again negative ([id number])  (3) Oncotype DX score of 9 predicts a risk of recurrence outside the breast within the next 10 years of 7% if the patient's only systemic treatment is tamoxifen for 5 years. It also predicts no benefit from chemotherapy.  (4) radiation therapy completed in [place] January 2015  (5) tamoxifen started 03/20/2013.  (a) switched to anastrozole July 2015 (but never started)  (b) estradiol level of 41 and FSH 22.8 noted 07/09/2013; goserelin started 08/22/2013, discontinued 11/14/2013  (c) tamoxifen resumed October 2015  PLAN: Jasmaine is now 3 years out from definitive surgery for her breast cancer with no evidence of disease recurrence. This is very favorable.  She is 2 years into the 5 planned years of tamoxifen. She generally tolerates that well.  The left armpit lymph nodule [facility] feels is going to be scar tissue. However this remains a source of great concern to her. I am setting her up for repeat ultrasound of the left axilla just to make sure there has been no change. I gave her a copy of the ultrasound of the left axilla  obtained last night.  Assuming the new study is unremarkable, she will return to see me in one year. She knows to call for any problems that may develop before that.  Chauncey Cruel, MD   11/23/2015 2:11 PM

## 2015-12-22 ENCOUNTER — Encounter: Payer: Self-pay | Admitting: Oncology

## 2016-01-11 ENCOUNTER — Other Ambulatory Visit: Payer: Self-pay | Admitting: Oncology

## 2016-01-11 DIAGNOSIS — Z170 Estrogen receptor positive status [ER+]: Principal | ICD-10-CM

## 2016-01-11 DIAGNOSIS — C50412 Malignant neoplasm of upper-outer quadrant of left female breast: Secondary | ICD-10-CM

## 2016-01-12 ENCOUNTER — Other Ambulatory Visit (HOSPITAL_COMMUNITY): Payer: Self-pay | Admitting: *Deleted

## 2016-01-12 DIAGNOSIS — R2232 Localized swelling, mass and lump, left upper limb: Secondary | ICD-10-CM

## 2016-01-28 ENCOUNTER — Ambulatory Visit
Admission: RE | Admit: 2016-01-28 | Discharge: 2016-01-28 | Disposition: A | Discharge status: Home / Self Care | Payer: No Typology Code available for payment source | Source: Ambulatory Visit | Attending: Obstetrics and Gynecology | Admitting: Obstetrics and Gynecology

## 2016-01-28 ENCOUNTER — Ambulatory Visit (HOSPITAL_COMMUNITY)
Admission: RE | Admit: 2016-01-28 | Discharge: 2016-01-28 | Disposition: A | Discharge status: Home / Self Care | Payer: No Typology Code available for payment source | Source: Ambulatory Visit | Attending: Obstetrics and Gynecology | Admitting: Obstetrics and Gynecology

## 2016-01-28 ENCOUNTER — Encounter (HOSPITAL_COMMUNITY): Payer: Self-pay

## 2016-01-28 VITALS — BP 114/72 | Temp 97.9°F | Ht 64.0 in | Wt 149.0 lb

## 2016-01-28 DIAGNOSIS — R2232 Localized swelling, mass and lump, left upper limb: Secondary | ICD-10-CM

## 2016-01-28 DIAGNOSIS — Z1239 Encounter for other screening for malignant neoplasm of breast: Secondary | ICD-10-CM

## 2016-01-28 NOTE — Progress Notes (Signed)
Complaints of a left axillary lump x 3 years that is painful at times. Patient rates the pain at a 2-3 stating sometimes it is an 8 out of 10..   Pap Smear:  Pap smear not completed today. Last Pap smear was 10/13/2014 at 85 for Women and normal. Per patient has a history of multiple abnormal Pap smears that required repeat Pap smears for follow up. Patient denies every having a colposcopy, cryotherapy, or LEEP. Last two Pap smear results are in EPIC.  Physical exam: Breasts Breasts symmetrical. No skin abnormalities right breast. Scar observed left upper outer breast and axilla. No nipple retraction bilateral breasts. No nipple discharge bilateral breasts. No lymphadenopathy. No lumps palpated right breast. Palpated a left axillary lump versus swollen lymph node at 2 o'clock 12 cm from the nipple. Complaints of left upper outer breast and axillary tenderness on exam. Referred patient to the [facility] for a left breast diagnostic mammogram and possible breast ultrasound. Appointment scheduled for Friday, January 28, 2016 at 0950.        Pelvic/Bimanual No Pap smear completed today since last Pap smear was 10/13/2014. Pap smear not indicated per BCCCP guidelines.   Smoking History: Patient has never smoked.  Patient Navigation: Patient education provided. Access to services provided for patient through [facility] program.   Colorectal Cancer Screening: Patient had a colonoscopy completed 5 years ago when she was 55 years old. No complaints today.

## 2016-01-28 NOTE — Patient Instructions (Addendum)
Explained breast self awareness with [name]. Patient did not need a Pap smear today due to last Pap smear was 10/13/2014. Let her know BCCCP will cover Pap smears every 3 years unless has a history of abnormal Pap smears. Referred patient to the [facility] for a left breast diagnostic mammogram and possible breast ultrasound. Appointment scheduled for Friday, January 28, 2016 at 0950. Patient aware of appointment and will be there. Margaret Reynolds verbalized understanding.  Carmita Boom, Arvil Chaco, RN 10:00 AM

## 2016-02-04 ENCOUNTER — Encounter (HOSPITAL_COMMUNITY): Payer: Self-pay | Admitting: *Deleted

## 2016-02-10 DIAGNOSIS — J00 Acute nasopharyngitis [common cold]: Secondary | ICD-10-CM | POA: Diagnosis not present

## 2016-02-18 DIAGNOSIS — Z01419 Encounter for gynecological examination (general) (routine) without abnormal findings: Secondary | ICD-10-CM | POA: Diagnosis not present

## 2016-02-18 DIAGNOSIS — Z6825 Body mass index (BMI) 25.0-25.9, adult: Secondary | ICD-10-CM | POA: Diagnosis not present

## 2016-03-01 ENCOUNTER — Encounter: Payer: Self-pay | Admitting: Oncology

## 2016-03-12 DIAGNOSIS — J209 Acute bronchitis, unspecified: Secondary | ICD-10-CM | POA: Diagnosis not present

## 2016-04-20 DIAGNOSIS — C50912 Malignant neoplasm of unspecified site of left female breast: Secondary | ICD-10-CM | POA: Diagnosis not present

## 2016-04-27 NOTE — Telephone Encounter (Signed)
MyChart msg mailed to pt

## 2016-05-30 DIAGNOSIS — H1045 Other chronic allergic conjunctivitis: Secondary | ICD-10-CM | POA: Diagnosis not present

## 2016-06-15 DIAGNOSIS — R5383 Other fatigue: Secondary | ICD-10-CM | POA: Diagnosis not present

## 2016-06-15 DIAGNOSIS — I10 Essential (primary) hypertension: Secondary | ICD-10-CM | POA: Diagnosis not present

## 2016-06-15 DIAGNOSIS — Z6825 Body mass index (BMI) 25.0-25.9, adult: Secondary | ICD-10-CM | POA: Diagnosis not present

## 2016-06-15 DIAGNOSIS — Z1389 Encounter for screening for other disorder: Secondary | ICD-10-CM | POA: Diagnosis not present

## 2016-06-15 DIAGNOSIS — Z0000 Encounter for general adult medical examination without abnormal findings: Secondary | ICD-10-CM | POA: Diagnosis not present

## 2016-06-15 DIAGNOSIS — Z13220 Encounter for screening for lipoid disorders: Secondary | ICD-10-CM | POA: Diagnosis not present

## 2016-06-20 DIAGNOSIS — D510 Vitamin B12 deficiency anemia due to intrinsic factor deficiency: Secondary | ICD-10-CM | POA: Diagnosis not present

## 2016-11-20 NOTE — Progress Notes (Signed)
[place]  [phone] [phone]     ID: Margaret Reynolds DOB: 1960-12-26  MR#: [id number]  EQA#:[id number]  Patient Care Team: Angelina Sheriff, MD as PCP - General (Family Medicine) PCP: Angelina Sheriff, MD GYN: SU: Excell Seltzer OTHER MD:  CHIEF COMPLAINT: Estrogen receptor positive breast cancer  CURRENT TREATMENT: Tamoxifen   BREAST CANCER HISTORY: From Dr Bernell List Khan's intake note 11/18/2012:  "Margaret Reynolds is a 56 y.o. female. With family history significant for mother with breast cancer at the age of 63. Patient has a history of dense breast she's been having high-risk mammography performed as well as MRIs. Recently she had an MRI of the breasts performed in the left breast at the 2:00 position there was noted to be a 1.9 cm mass nodes were within the left axilla were normal. She had an ultrasound performed and this confirmed at the 2:00 position a 1.2 cm area of concern. She underwent a biopsy. This showed a invasive ductal carcinoma grade 1 low grade node was negative. There was associated DCIS. Tumor was ER positive PR positive HER-2/neu negative with a proliferation marker Ki-67 of only 10%. Patient's case was discussed at the multidisciplinary breast conference her pathology and radiology were reviewed. Treatment plan was made. Patient was seen also by Dr. Arloa Koh and Dr. Neldon Mc. Patient herself is without any significant complaints."  Her subsequent history is as detailed below.  INTERVAL HISTORY: Margaret Reynolds returns today for follow-up and treatment of her estrogen receptor positive breast cancer. She continues on tamoxifen, with good tolerance. She has minimal hot flashes, but notes that she may be going "through the change." She denies an increase in vaginal wetness. She hasn't taken tamoxifen in 1 month due to thinking that she will d/c use after 5 years which she was calculating from her surgery date that occurred  in 2014.   Since [name]'s last visit to the office, she has had a diagnostic left breast mammography with tomography and an [place] of her left axilla completed on 01/28/2016 at [facility] with results of: No evidence of malignancy in the left breast. Lumpectomy changes in the upper outer quadrant.   REVIEW OF SYSTEMS: Tacarra reports residual left axilla pain that comes and goes s/p surgical procedure. 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 She denies unusual headaches, visual changes, nausea, vomiting, or dizziness. There has been no unusual cough, phlegm production, or pleurisy. This been no change in bowel or bladder habits. She denies unexplained fatigue or unexplained weight loss, bleeding, rash, or fever. A detailed review of systems was otherwise stable.    PAST MEDICAL HISTORY: Past Medical History:  Diagnosis Date  . Arthritis   . Breast cancer ([place])   . Contact lens/glasses fitting    wears contacts or glasses  . Hypertension     PAST SURGICAL HISTORY: Past Surgical History:  Procedure Laterality Date  . BREAST LUMPECTOMY  9798,9211   lt br,rt   . BREAST LUMPECTOMY WITH NEEDLE LOCALIZATION AND AXILLARY SENTINEL LYMPH NODE BX Left 11/19/2012   Procedure: BREAST LUMPECTOMY WITH NEEDLE LOCALIZATION AND AXILLARY SENTINEL LYMPH NODE BIOPSY;  Surgeon: Haywood Lasso, MD;  Location: [facility];  Service: General;  Laterality: Left;  . BUNIONECTOMY  2013   right foot  . CESAREAN SECTION    . COLONOSCOPY    . MINOR FULGERATION OF ANAL CONDYLOMA  2007    FAMILY HISTORY Family History  Problem Relation Age of Onset  . Breast cancer Mother 23  . Hypertension Mother   . Diabetes Mother   . Melanoma Father 36  . Hypertension Father   . Diabetes Father   . Cervical cancer Maternal Grandmother 37  . Cancer Paternal Uncle        Cancer NOS  . Diabetes Paternal Uncle    the patient's father died  from a heart attack at the age of 39. He had a history of melanoma, for which he received interleukin, and also myasthenia gravis. The patient's mother is living. She was diagnosed with breast cancer in her 38s. The patient had no sisters.She has 2 brothers. There is no other history of breast or ovarian cancer in the family to her knowledge  GYNECOLOGIC HISTORY:  No LMP recorded. Patient is postmenopausal. Menarche age 57, first live birth at 72. She is GX P2. She stopped having periods several years ago, when she had an implant placed. She had the implant removed she thinks 2 or 3 years ago and her periods did not resume after that. She received Zoladex here monthly beginning mid-July 2015 and stopping 11/14/2013. She noted little difference when going off Zoladex.   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  Her husband , Marden Noble, is a Administrator. Son Aaron Edelman works in the [employer]. Son Rodman Key works in Information systems manager. The patient has 1 grandchild. She is a Tourist information centre manager    ADVANCED DIRECTIVES: Not in place   HEALTH MAINTENANCE: Social History  Substance Use Topics  . Smoking status: Former Smoker    Packs/day: 0.30    Years: 30.00    Quit date: 11/14/2008  . Smokeless tobacco: Never Used  . Alcohol use 4.2 oz/week    7 Cans of beer per week     Comment: occ     Colonoscopy: At age 27/Dr. Melina Copa  PAP:  Bone density:  Lipid panel:  No Known Allergies  Current Outpatient Prescriptions  Medication Sig Dispense Refill  . Calcium Citrate-Vitamin D (CITRACAL + D PO) Take by mouth.    . enalapril (VASOTEC) 5 MG tablet Take 5 mg by mouth daily.    . tamoxifen (NOLVADEX) 20 MG tablet Take 1 tablet (20 mg total) by mouth daily. 90 tablet 3   No current facility-administered medications for this visit.     OBJECTIVE: Middle-aged white woman in no acute distress  Vitals:   11/22/16 1350  BP: 120/77  Pulse: 90    Resp: 18  Temp: 98.2 F (36.8 C)  SpO2: 100%     Body mass index is 26.04 kg/m.    ECOG FS:0 - Asymptomatic  Sclerae unicteric, pupils round and equal Oropharynx clear and moist No cervical or supraclavicular adenopathy Lungs no rales or rhonchi Heart regular rate and rhythm Abd soft, nontender, positive bowel sounds MSK no focal spinal tenderness, no upper extremity lymphedema Neuro: nonfocal, well oriented, appropriate affect Breasts: The right breast is benign. The left breast is undergone lumpectomy followed by radiation, with no evidence of local recurrence. I do not palpate any abnormality in the left axilla or left medial upper arm  LAB RESULTS:  CMP     Component Value Date/Time   NA 141 11/23/2015 1313   K 4.0 11/23/2015 1313   CO2 23 11/23/2015 1313   GLUCOSE 108 11/23/2015 1313   BUN 11.3 11/23/2015 1313  CREATININE 0.8 11/23/2015 1313   CALCIUM 9.8 11/23/2015 1313   PROT 6.9 11/23/2015 1313   ALBUMIN 4.1 11/23/2015 1313   AST 18 11/23/2015 1313   ALT 19 11/23/2015 1313   ALKPHOS 62 11/23/2015 1313   BILITOT 0.48 11/23/2015 1313    INo results found for: SPEP, UPEP  Lab Results  Component Value Date   WBC 9.6 11/22/2016   NEUTROABS 6.8 (H) 11/22/2016   HGB 14.1 11/22/2016   HCT 41.3 11/22/2016   MCV 90.6 11/22/2016   PLT 266 11/22/2016      Chemistry      Component Value Date/Time   NA 141 11/23/2015 1313   K 4.0 11/23/2015 1313   CO2 23 11/23/2015 1313   BUN 11.3 11/23/2015 1313   CREATININE 0.8 11/23/2015 1313      Component Value Date/Time   CALCIUM 9.8 11/23/2015 1313   ALKPHOS 62 11/23/2015 1313   AST 18 11/23/2015 1313   ALT 19 11/23/2015 1313   BILITOT 0.48 11/23/2015 1313       No results found for: LABCA2  No components found for: LABCA125  No results for input(s): INR in the last 168 hours.  Urinalysis No results found for: COLORURINE, APPEARANCEUR, LABSPEC, PHURINE, GLUCOSEU, HGBUR, BILIRUBINUR, KETONESUR, PROTEINUR,  UROBILINOGEN, NITRITE, LEUKOCYTESUR  STUDIES: She has had a diagnostic left breast mammography with tomography and an [place] of her left axilla completed on 01/28/2016 at [facility] with results of: Breast density C. 1. No evidence of malignancy in the left breast. Lumpectomy changes in the upper outer quadrant. 2. No evidence of lymphadenopathy or mass in the left axilla. There are postsurgical changes of prior sentinel lymph node biopsy.   ASSESSMENT: 56 y.o. [place] woman status post left upper outer quadrant breast biopsy 10/21/2012 for a clinical T1c N0 invasive ductal carcinoma, grade 1, estrogen receptor and progesterone receptor both at 100% positive with strong staining intensity, with an MIB-1 of 10%, and no HER-2 amplification, the signals ratio being 1.18. ([id number])  (1) biopsy of a suspicious left axillary lymph node 10/21/2012 was negative  (2) status post left lumpectomy and sentinel lymph node sampling 11/19/2012 for a pT1c pN0, stage IA invasive ductal carcinoma, grade 1, with negative margins. Repeat HER-2 was again negative ([id number])  (3) Oncotype DX score of 9 predicts a risk of recurrence outside the breast within the next 10 years of 7% if the patient's only systemic treatment is tamoxifen for 5 years. It also predicts no benefit from chemotherapy.  (4) radiation therapy completed in [place] January 2015  (5) tamoxifen started 03/20/2013.  (a) switched to anastrozole July 2015 (but never started)  (b) estradiol level of 41 and FSH 22.8 noted 07/09/2013; goserelin started 08/22/2013, discontinued 11/14/2013  (c) tamoxifen resumed October 2015  PLAN: Lubna thought she was done with tamoxifen but she really has about 14 more months to go. She is willing to continue. I have refilled that for her.  She is now 4 years out from definitive surgery for her breast cancer with no evidence of disease recurrence. That is very favorable.  Unfortunately I don't have a  solution for the discomfort she chronically experiences in her left arm. I thought that perhaps some stretching exercises might be helpful but she is not interested in proceeding to rehabilitation at this point  I'm going to see her one less time, January 2020. That will be after her mammogram earlier that year. Assuming all goes well she can "  graduate" at that point  She knows to call for any problems that may develop before the next visit.  Magrinat, Virgie Dad, MD  11/22/16 2:08 PM Medical Oncology and Hematology [facility] [address] [place], [place] [address] Tel. [phone]    Fax. [phone]  This document serves as a record of services personally performed by Lurline Del, MD. It was created on her behalf by Steva Colder, a trained medical scribe. The creation of this record is based on the scribe's personal observations and the provider's statements to them. This document has been checked and approved by the attending provider.

## 2016-11-21 ENCOUNTER — Other Ambulatory Visit: Payer: Self-pay | Admitting: *Deleted

## 2016-11-21 DIAGNOSIS — C50412 Malignant neoplasm of upper-outer quadrant of left female breast: Secondary | ICD-10-CM

## 2016-11-21 DIAGNOSIS — Z170 Estrogen receptor positive status [ER+]: Principal | ICD-10-CM

## 2016-11-22 ENCOUNTER — Other Ambulatory Visit (HOSPITAL_BASED_OUTPATIENT_CLINIC_OR_DEPARTMENT_OTHER): Payer: Self-pay

## 2016-11-22 ENCOUNTER — Ambulatory Visit (HOSPITAL_BASED_OUTPATIENT_CLINIC_OR_DEPARTMENT_OTHER): Payer: Self-pay | Admitting: Oncology

## 2016-11-22 VITALS — BP 120/77 | HR 90 | Temp 98.2°F | Resp 18 | Ht 64.0 in | Wt 151.7 lb

## 2016-11-22 DIAGNOSIS — Z170 Estrogen receptor positive status [ER+]: Secondary | ICD-10-CM

## 2016-11-22 DIAGNOSIS — Z79811 Long term (current) use of aromatase inhibitors: Secondary | ICD-10-CM

## 2016-11-22 DIAGNOSIS — C50412 Malignant neoplasm of upper-outer quadrant of left female breast: Secondary | ICD-10-CM

## 2016-11-22 LAB — CBC WITH DIFFERENTIAL/PLATELET
BASO%: 0.9 % (ref 0.0–2.0)
Basophils Absolute: 0.1 10*3/uL (ref 0.0–0.1)
EOS ABS: 0.2 10*3/uL (ref 0.0–0.5)
EOS%: 2 % (ref 0.0–7.0)
HCT: 41.3 % (ref 34.8–46.6)
HGB: 14.1 g/dL (ref 11.6–15.9)
LYMPH%: 22 % (ref 14.0–49.7)
MCH: 31.1 pg (ref 25.1–34.0)
MCHC: 34.3 g/dL (ref 31.5–36.0)
MCV: 90.6 fL (ref 79.5–101.0)
MONO#: 0.4 10*3/uL (ref 0.1–0.9)
MONO%: 4.4 % (ref 0.0–14.0)
NEUT#: 6.8 10*3/uL — ABNORMAL HIGH (ref 1.5–6.5)
NEUT%: 70.7 % (ref 38.4–76.8)
Platelets: 266 10*3/uL (ref 145–400)
RBC: 4.55 10*6/uL (ref 3.70–5.45)
RDW: 12.7 % (ref 11.2–14.5)
WBC: 9.6 10*3/uL (ref 3.9–10.3)
lymph#: 2.1 10*3/uL (ref 0.9–3.3)

## 2016-11-22 LAB — COMPREHENSIVE METABOLIC PANEL
ALT: 16 U/L (ref 0–55)
AST: 16 U/L (ref 5–34)
Albumin: 4.1 g/dL (ref 3.5–5.0)
Alkaline Phosphatase: 63 U/L (ref 40–150)
Anion Gap: 11 mEq/L (ref 3–11)
BUN: 9.1 mg/dL (ref 7.0–26.0)
CHLORIDE: 106 meq/L (ref 98–109)
CO2: 24 meq/L (ref 22–29)
Calcium: 9.6 mg/dL (ref 8.4–10.4)
Creatinine: 0.8 mg/dL (ref 0.6–1.1)
GLUCOSE: 92 mg/dL (ref 70–140)
POTASSIUM: 4 meq/L (ref 3.5–5.1)
SODIUM: 141 meq/L (ref 136–145)
TOTAL PROTEIN: 6.9 g/dL (ref 6.4–8.3)
Total Bilirubin: 0.54 mg/dL (ref 0.20–1.20)

## 2016-11-22 MED ORDER — TAMOXIFEN CITRATE 20 MG PO TABS: 20.0000 mg | ORAL_TABLET | Freq: Every day | ORAL | 3 refills | Status: DC

## 2016-11-27 ENCOUNTER — Encounter: Payer: Self-pay | Admitting: Oncology

## 2016-12-04 ENCOUNTER — Other Ambulatory Visit (HOSPITAL_COMMUNITY): Payer: Self-pay | Admitting: *Deleted

## 2016-12-04 DIAGNOSIS — Z853 Personal history of malignant neoplasm of breast: Secondary | ICD-10-CM

## 2017-01-02 ENCOUNTER — Inpatient Hospital Stay: Admission: RE | Admit: 2017-01-02 | Payer: No Typology Code available for payment source | Source: Ambulatory Visit

## 2017-01-02 ENCOUNTER — Inpatient Hospital Stay (HOSPITAL_COMMUNITY): Admission: RE | Admit: 2017-01-02 | Payer: No Typology Code available for payment source | Source: Ambulatory Visit

## 2017-01-11 ENCOUNTER — Ambulatory Visit (HOSPITAL_COMMUNITY)
Admission: RE | Admit: 2017-01-11 | Discharge: 2017-01-11 | Disposition: A | Discharge status: Home / Self Care | Payer: No Typology Code available for payment source | Source: Ambulatory Visit | Attending: Obstetrics and Gynecology | Admitting: Obstetrics and Gynecology

## 2017-01-11 ENCOUNTER — Encounter (HOSPITAL_COMMUNITY): Payer: Self-pay

## 2017-01-11 ENCOUNTER — Ambulatory Visit
Admission: RE | Admit: 2017-01-11 | Discharge: 2017-01-11 | Disposition: A | Discharge status: Home / Self Care | Payer: No Typology Code available for payment source | Source: Ambulatory Visit | Attending: Obstetrics and Gynecology | Admitting: Obstetrics and Gynecology

## 2017-01-11 VITALS — BP 128/72 | Temp 97.6°F | Ht 64.0 in | Wt 148.2 lb

## 2017-01-11 DIAGNOSIS — Z1239 Encounter for other screening for malignant neoplasm of breast: Secondary | ICD-10-CM

## 2017-01-11 DIAGNOSIS — Z853 Personal history of malignant neoplasm of breast: Secondary | ICD-10-CM

## 2017-01-11 HISTORY — DX: Personal history of irradiation: Z92.3

## 2017-01-11 NOTE — Patient Instructions (Signed)
Explained breast self awareness with [name]. Patient did not need a Pap smear today due to last Pap smear was 10/13/2014. Let her know BCCCP will cover Pap smears every 3 years unless has a history of abnormal Pap smears. Referred patient to the [facility] for diagnostic mammogram per recommendation. Appointment scheduled for Thursday, January 11, 2017 at 1050. Jon Kasparek Kohler verbalized understanding.  Brynlynn Walko, Arvil Chaco, RN 10:18 AM

## 2017-01-11 NOTE — Progress Notes (Signed)
Complaints of left breast tenderness at lumpectomy site.  Pap Smear: Pap smear not completed today. Last Pap smear was 10/13/2014 at [facility] and normal. Per patient has a history of multiple abnormal Pap smears with repeat Pap smears completed for follow-up. Patient has had at least three normal Pap smears in row since last abnormal. Last three Pap smear results are in Epic and normal.   Physical exam: Breasts Breasts symmetrical. No skin abnormalities bilateral breasts. No nipple retraction bilateral breasts. No nipple discharge bilateral breasts. No lymphadenopathy. No lumps palpated bilateral breasts. No complaints of pain or tenderness on exam. Referred patient to the [facility] for diagnostic mammogram per recommendation. Appointment scheduled for Thursday, January 11, 2017 at 1050.        Pelvic/Bimanual No Pap smear completed today since last Pap smear was 10/13/2014. Pap smear not indicated per BCCCP guidelines.   Smoking History: Patient has never smoked.  Patient Navigation: Patient education provided. Access to services provided for patient through [facility] program.   Colorectal Cancer Screening: Per patient had a colonoscopy completed 6 years ago. No complaints today. FIT Test given to patient to complete and return to BCCCP.

## 2017-01-12 ENCOUNTER — Encounter (HOSPITAL_COMMUNITY): Payer: Self-pay

## 2017-05-11 DIAGNOSIS — C50912 Malignant neoplasm of unspecified site of left female breast: Secondary | ICD-10-CM | POA: Diagnosis not present

## 2017-05-15 ENCOUNTER — Other Ambulatory Visit: Payer: Self-pay | Admitting: Oncology

## 2017-06-01 ENCOUNTER — Ambulatory Visit: Payer: BLUE CROSS/BLUE SHIELD | Admitting: Physical Therapy

## 2017-06-08 ENCOUNTER — Ambulatory Visit: Payer: BLUE CROSS/BLUE SHIELD | Attending: General Surgery | Admitting: Physical Therapy

## 2017-06-08 DIAGNOSIS — Z483 Aftercare following surgery for neoplasm: Secondary | ICD-10-CM | POA: Diagnosis not present

## 2017-06-08 DIAGNOSIS — R293 Abnormal posture: Secondary | ICD-10-CM

## 2017-06-08 DIAGNOSIS — L599 Disorder of the skin and subcutaneous tissue related to radiation, unspecified: Secondary | ICD-10-CM

## 2017-06-08 NOTE — Therapy (Signed)
[place], [place], [address] Phone: [phone]   Fax:  [phone]  Physical Therapy Evaluation  Patient Details  Name: Margaret Reynolds MRN: [id number] Date of Birth: 1960-10-29 Referring Provider: Dr. Excell Seltzer    Encounter Date: 06/08/2017  PT End of Session - 06/08/17 1207    Visit Number  1    Number of Visits  9 pt will not ba able to attend all sessions     Date for PT Re-Evaluation  07/09/17    PT Start Time  1100    PT Stop Time  1145    PT Time Calculation (min)  45 min    Activity Tolerance  Patient tolerated treatment well    Behavior During Therapy  [facility] for tasks assessed/performed       Past Medical History:  Diagnosis Date  . Arthritis   . Breast cancer ([facility])    Left breast  . Contact lens/glasses fitting    wears contacts or glasses  . Hypertension   . Personal history of radiation therapy     Past Surgical History:  Procedure Laterality Date  . BREAST EXCISIONAL BIOPSY Left 1981  . BREAST EXCISIONAL BIOPSY Right 1993  . BREAST LUMPECTOMY Left 11/19/2012  . BREAST LUMPECTOMY WITH NEEDLE LOCALIZATION AND AXILLARY SENTINEL LYMPH NODE BX Left 11/19/2012   Procedure: BREAST LUMPECTOMY WITH NEEDLE LOCALIZATION AND AXILLARY SENTINEL LYMPH NODE BIOPSY;  Surgeon: Haywood Lasso, MD;  Location: [facility];  Service: General;  Laterality: Left;  . BUNIONECTOMY  2013   right foot  . CESAREAN SECTION    . COLONOSCOPY    . MINOR FULGERATION OF ANAL CONDYLOMA  2007    There were no vitals filed for this visit.   Subjective Assessment - 06/08/17 1111    Subjective  " I have a knot under my arm and it hurts"  Its where they took a lymph node out. Pt has had trouble with this for about 5 years." not as bad in the mornings as in the afternoons and i know that is because I use my arm all day"  She has used a  compression sleeve with no help .  She thinks its scar tissue       Pertinent History  Left lumpectomy in 2014 with one lymph node removed.  She had radiation but no chemo.  She was not able to afford tamoxifen and plans to talk to Dr Jana Hakim about that at her June 24 appointment     Patient Stated Goals  to get rid of the pain under her arm     Currently in Pain?  Yes    Pain Score  8  at the end of the day     Pain Location  Axilla    Pain Orientation  Left    Pain Descriptors / Indicators  Aching;Constant    Pain Type  Chronic pain    Pain Radiating Towards  sometimes moves down into breast and sometimes down into left arm     Pain Onset  More than a month ago    Aggravating Factors   working all day, its worse when she lays on it     Pain Relieving Factors  shakes her arm in the morning when she gets gets up     Effect of Pain on Daily Activities  interferes with sleep, hand goes numb at times          [facility]  PT Assessment - 06/08/17 0001      Assessment   Medical Diagnosis  left breast cancer     Referring Provider  Dr. Excell Seltzer     Onset Date/Surgical Date  11/19/12    Hand Dominance  Left      Precautions   Precautions  None      Restrictions   Weight Bearing Restrictions  No      Balance Screen   Has the patient fallen in the past 6 months  No    Has the patient had a decrease in activity level because of a fear of falling?   No    Is the patient reluctant to leave their home because of a fear of falling?   No      Home Social worker  Private residence    Living Arrangements  Spouse/significant other;Children spouse son and grandson    Available Help at Discharge  Available PRN/intermittently      Prior Function   Level of Independence  Independent    Vocation  Full time employment    Technical sales engineer, has her own business, has to manage heavy and awkward fabrics, heavy boxes     Leisure  "I consider work exercise"       Cognition   Overall Cognitive Status  Within Functional Limits for  tasks assessed      Observation/Other Assessments   Observations  Pt has contacted point near axillary node dissection sit  with full painful area at axilla and down toward lateral chest .. very tender to palapation and pt feels that sometimes she cannot put her arm all the way down to her side     Quick DASH   15.91      Sensation   Light Touch  Not tested      Coordination   Gross Motor Movements are Fluid and Coordinated  Yes      Posture/Postural Control   Posture/Postural Control  Postural limitations    Postural Limitations  Rounded Shoulders;Forward head      ROM / Strength   AROM / PROM / Strength  AROM;Strength      AROM   Right/Left Shoulder  Right;Left    Right Shoulder Flexion  160 Degrees    Right Shoulder ABduction  160 Degrees    Left Shoulder Flexion  160 Degrees    Left Shoulder ABduction  160 Degrees      Strength   Right Shoulder Flexion  4+/5    Right Shoulder ABduction  4+/5    Left Shoulder Flexion  4/5    Left Shoulder ABduction  4/5      Palpation   Palpation comment  very tender full area axilla with contraction at scar with decreased mobility and guitar string cording coming from this area toward left upper arm         LYMPHEDEMA/ONCOLOGY QUESTIONNAIRE - 06/08/17 1131      Right Upper Extremity Lymphedema   10 cm Proximal to Olecranon Process  29 cm    Olecranon Process  25 cm    15 cm Proximal to Ulnar Styloid Process  25 cm    10 cm Proximal to Ulnar Styloid Process  23 cm    Just Proximal to Ulnar Styloid Process  17 cm    Across Hand at [employer]  19.5 cm    At [place] of 2nd Digit  6.5 cm      Left Upper Extremity  Lymphedema   10 cm Proximal to Olecranon Process  29 cm    Olecranon Process  25 cm    15 cm Proximal to Ulnar Styloid Process  25 cm    10 cm Proximal to Ulnar Styloid Process  23 cm    Just Proximal to Ulnar Styloid Process  16.5 cm    Across Hand at [employer]  19.5 cm    At [place] of 2nd Digit  6.5 cm           Quick Dash - 06/08/17 0001    Open a tight or new jar  No difficulty    Do heavy household chores (wash walls, wash floors)  No difficulty    Carry a shopping bag or briefcase  No difficulty    Wash your back  No difficulty    Use a knife to cut food  No difficulty    Recreational activities in which you take some force or impact through your arm, shoulder, or hand (golf, hammering, tennis)  No difficulty    During the past week, to what extent has your arm, shoulder or hand problem interfered with your normal social activities with family, friends, neighbors, or groups?  Not at all    During the past week, to what extent has your arm, shoulder or hand problem limited your work or other regular daily activities  Not at all    Arm, shoulder, or hand pain.  Mild    Tingling (pins and needles) in your arm, shoulder, or hand  Severe    Difficulty Sleeping  Severe difficulty    DASH Score  15.91 %           Above area is firm and very tender   Objective measurements completed on examination: See above findings.      [place] Adult PT Treatment/Exercise - 06/08/17 0001      Self-Care   Self-Care  Other Self-Care Comments    Other Self-Care Comments   briefly instructed in scar massage and showed pt option for compression vest              PT Education - 06/08/17 1205    Education provided  Yes    Education Details  instructed pt to start doing self massage to scar areas but she will need more instruction     Person(s) Educated  Patient    Methods  Explanation;Demonstration    Comprehension  Verbalized understanding          PT Long Term Goals - 06/08/17 1217      PT LONG TERM GOAL #1   Title  Pt will report her symptoms of pain and swelling in left axillary area have decreased by 50%     Time  4    Period  Weeks    Status  New      PT LONG TERM GOAL #2   Title  Pt will be independent in home program for stretching and scar massage     Time  4    Period   Weeks    Status  New             Plan - 06/08/17 1207    Clinical Impression Statement  57 yo female 5 years post lumpectomy with sentinel node biopsy who has been having difficulties with pain and swelling in left axiallary area since the time of surgery.  She had one session of PT for manual lymph drainage right after  surgery and has used a compression sleeve, but her symptoms persist.  She has limited financial resources and may not be able to afford co-pays so will ask if she can get assist from [facility] for those.     History and Personal Factors relevant to plan of care:  previous surgery and radiation, limited finances for copays     Clinical Presentation  Stable    Clinical Decision Making  Low    Rehab Potential  Good    Clinical Impairments Affecting Rehab Potential  previous radiation , prolonged time of symptoms , pt reports sensitivity to tapes so may not be able to tolerate kinesiotape     PT Frequency  2x / week    PT Duration  4 weeks    PT Treatment/Interventions  ADLs/Self Care Home Management;Therapeutic exercise;Therapeutic activities;DME Instruction;Neuromuscular re-education;Patient/family education;Orthotic Fit/Training;Manual techniques;Taping;Passive range of motion;Scar mobilization;Manual lymph drainage    PT Next Visit Plan  scar massage, manual tecniques and myofascial release to left axilla and lateral chest, instruct about where to get a [place] compression bra, HEP for shoulder /axllary stretching focus on teaching self treatment as pt may not be able to come often, check on [employer] assist for co-pays     Consulted and Agree with Plan of Care  --       Patient will benefit from skilled therapeutic intervention in order to improve the following deficits and impairments:  Increased edema, Decreased scar mobility, Decreased knowledge of precautions, Decreased knowledge of use of DME, Decreased strength, Increased fascial restricitons, Impaired UE functional use,  Pain, Impaired perceived functional ability, Postural dysfunction  Visit Diagnosis: Aftercare following surgery for neoplasm - Plan: PT plan of care cert/re-cert  Disorder of the skin and subcutaneous tissue related to radiation, unspecified - Plan: PT plan of care cert/re-cert  Abnormal posture - Plan: PT plan of care cert/re-cert     Problem List Patient Active Problem List   Diagnosis Date Noted  . Hot flashes 11/11/2014  . Seroma complicating a procedure 12/31/2012  . Malignant neoplasm of upper-outer quadrant of left breast in female, estrogen receptor positive ([facility]) 10/24/2012   Donato Heinz. Owens Shark PT  Norwood Levo 06/08/2017, 12:22 PM  [place], [place], [address] Phone: [phone]   Fax:  [phone]  Name: Margaret Reynolds MRN: [id number] Date of Birth: 11-Oct-1960

## 2017-06-11 ENCOUNTER — Telehealth: Payer: Self-pay | Admitting: Physical Therapy

## 2017-06-11 NOTE — Telephone Encounter (Signed)
Tried to call pt to inform her that she will likely not be eligible for [facility] funds as she has insurance and is > 18 months since active treatment.  No answer.  Left a message and will call pt back tomorrow.   Donato Heinz. Owens Shark, PT

## 2017-06-12 ENCOUNTER — Telehealth: Payer: Self-pay | Admitting: Physical Therapy

## 2017-06-12 NOTE — Telephone Encounter (Signed)
Left pt for pt to call us back for this information as I did not leave the details on the phone .  I received information from [employer] that they will not be able to assist her with therapy copayments. Patient is welcome to contact pt billing and work with them to set up a payment plan if needed. Donato Heinz. Owens Shark, PT

## 2017-07-22 NOTE — Progress Notes (Signed)
No show

## 2017-07-23 ENCOUNTER — Encounter: Payer: Self-pay | Admitting: Oncology

## 2017-07-23 ENCOUNTER — Ambulatory Visit: Payer: Self-pay | Admitting: Oncology

## 2017-07-23 DIAGNOSIS — Z6825 Body mass index (BMI) 25.0-25.9, adult: Secondary | ICD-10-CM | POA: Diagnosis not present

## 2017-07-23 DIAGNOSIS — E785 Hyperlipidemia, unspecified: Secondary | ICD-10-CM | POA: Diagnosis not present

## 2017-07-23 DIAGNOSIS — J00 Acute nasopharyngitis [common cold]: Secondary | ICD-10-CM | POA: Diagnosis not present

## 2017-07-23 DIAGNOSIS — I10 Essential (primary) hypertension: Secondary | ICD-10-CM | POA: Diagnosis not present

## 2017-07-23 DIAGNOSIS — D510 Vitamin B12 deficiency anemia due to intrinsic factor deficiency: Secondary | ICD-10-CM | POA: Diagnosis not present

## 2017-08-31 DIAGNOSIS — Z808 Family history of malignant neoplasm of other organs or systems: Secondary | ICD-10-CM | POA: Diagnosis not present

## 2017-08-31 DIAGNOSIS — Z803 Family history of malignant neoplasm of breast: Secondary | ICD-10-CM | POA: Diagnosis not present

## 2017-08-31 DIAGNOSIS — Z6826 Body mass index (BMI) 26.0-26.9, adult: Secondary | ICD-10-CM | POA: Diagnosis not present

## 2017-08-31 DIAGNOSIS — Z8049 Family history of malignant neoplasm of other genital organs: Secondary | ICD-10-CM | POA: Diagnosis not present

## 2017-08-31 DIAGNOSIS — Z01419 Encounter for gynecological examination (general) (routine) without abnormal findings: Secondary | ICD-10-CM | POA: Diagnosis not present

## 2017-08-31 DIAGNOSIS — Z853 Personal history of malignant neoplasm of breast: Secondary | ICD-10-CM | POA: Diagnosis not present

## 2017-11-16 DIAGNOSIS — Z809 Family history of malignant neoplasm, unspecified: Secondary | ICD-10-CM | POA: Diagnosis not present

## 2017-12-25 ENCOUNTER — Other Ambulatory Visit: Payer: Self-pay | Admitting: Obstetrics and Gynecology

## 2017-12-25 DIAGNOSIS — Z853 Personal history of malignant neoplasm of breast: Secondary | ICD-10-CM

## 2018-01-17 ENCOUNTER — Ambulatory Visit
Admission: RE | Admit: 2018-01-17 | Discharge: 2018-01-17 | Disposition: A | Discharge status: Home / Self Care | Payer: BLUE CROSS/BLUE SHIELD | Source: Ambulatory Visit | Attending: Obstetrics and Gynecology | Admitting: Obstetrics and Gynecology

## 2018-01-17 DIAGNOSIS — Z853 Personal history of malignant neoplasm of breast: Secondary | ICD-10-CM | POA: Diagnosis not present

## 2018-01-17 DIAGNOSIS — R922 Inconclusive mammogram: Secondary | ICD-10-CM | POA: Diagnosis not present

## 2018-01-25 DIAGNOSIS — J209 Acute bronchitis, unspecified: Secondary | ICD-10-CM | POA: Diagnosis not present

## 2018-01-25 DIAGNOSIS — Z6826 Body mass index (BMI) 26.0-26.9, adult: Secondary | ICD-10-CM | POA: Diagnosis not present

## 2018-02-11 ENCOUNTER — Other Ambulatory Visit: Payer: Self-pay | Admitting: Obstetrics and Gynecology

## 2018-02-11 DIAGNOSIS — Z853 Personal history of malignant neoplasm of breast: Secondary | ICD-10-CM

## 2018-02-21 ENCOUNTER — Ambulatory Visit
Admission: RE | Admit: 2018-02-21 | Discharge: 2018-02-21 | Disposition: A | Discharge status: Home / Self Care | Payer: No Typology Code available for payment source | Source: Ambulatory Visit | Attending: Obstetrics and Gynecology | Admitting: Obstetrics and Gynecology

## 2018-02-21 DIAGNOSIS — Z853 Personal history of malignant neoplasm of breast: Secondary | ICD-10-CM

## 2018-02-21 MED ORDER — GADOBUTROL 1 MMOL/ML IV SOLN
7.0000 mL | Freq: Once | INTRAVENOUS | Status: AC | PRN
  Administered 2018-02-21: 7 mL via INTRAVENOUS

## 2018-04-12 DIAGNOSIS — C50912 Malignant neoplasm of unspecified site of left female breast: Secondary | ICD-10-CM | POA: Diagnosis not present

## 2018-05-16 IMAGING — MG 2D DIGITAL DIAGNOSTIC UNILATERAL LEFT MAMMOGRAM WITH CAD AND ADJ
8 series · 8 of 20 positions shown · non-contrast
Comparison: Previous exam(s).

CLINICAL DATA: 55-year-old patient with palpable area of concern in
the left axilla, cephalad to the cutaneous excisional biopsy scar
related to sentinel lymph node biopsy. She reports chronic
tenderness in the left axilla after surgery. She has a history of
left breast cancer, diagnosed in 0052, Stage 1A. Sentinel lymph node
biopsy was negative.

Patient underwent biopsy of a left breast mass in Sunday May, 2015,
showing benign fibroadenoma. Biopsy of a left axillary lymph node in
EXAM:
DIGITAL DIAGNOSTIC LEFT MAMMOGRAM WITH CAD
ULTRASOUND LEFT AXILLA

[L TAN]
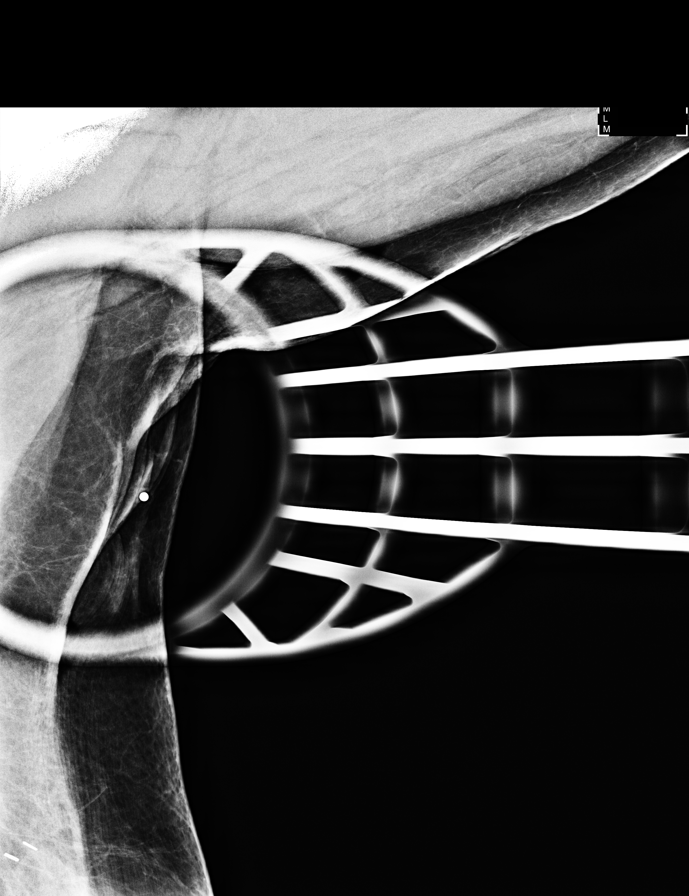

[L MLO]
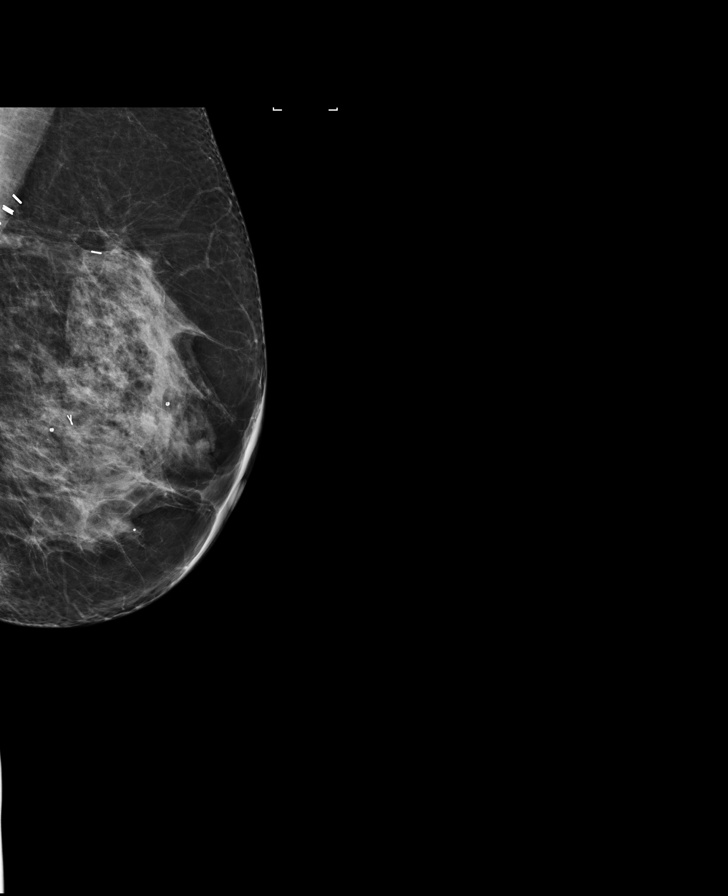

[L CC]
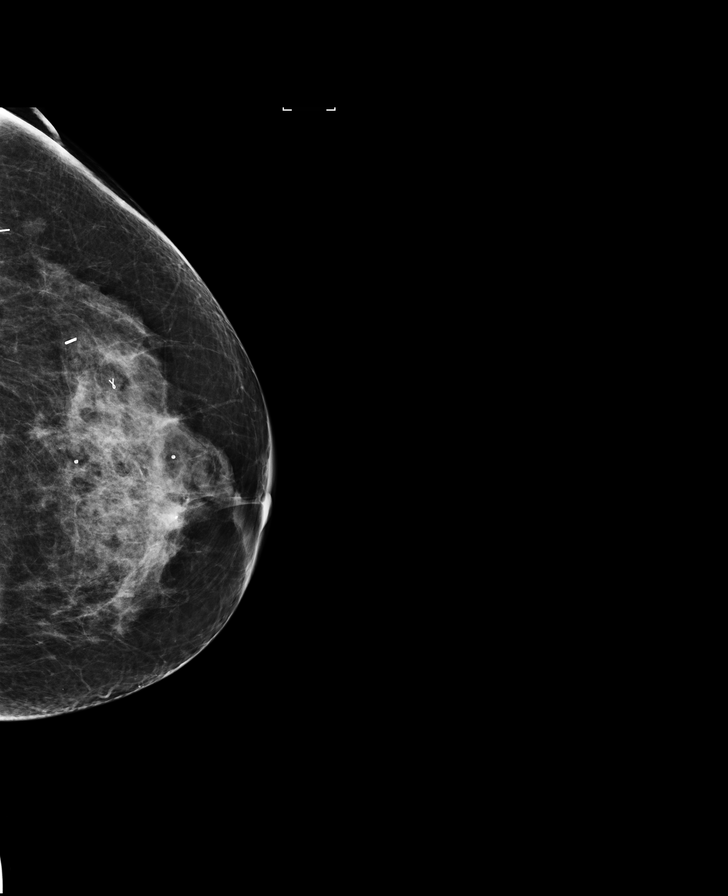

[L CC synth-2D]
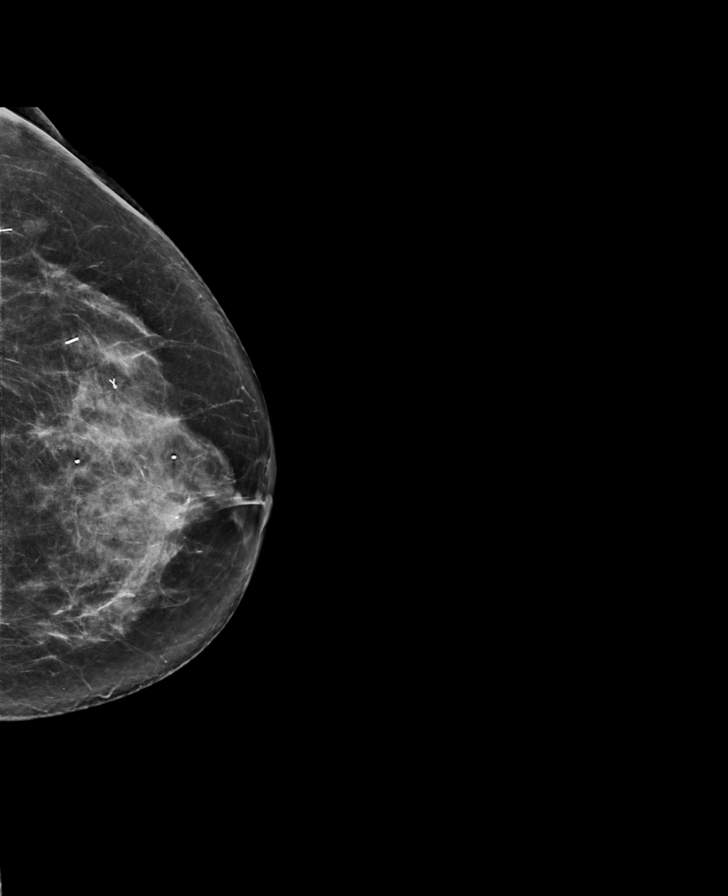

[L MLO synth-2D]
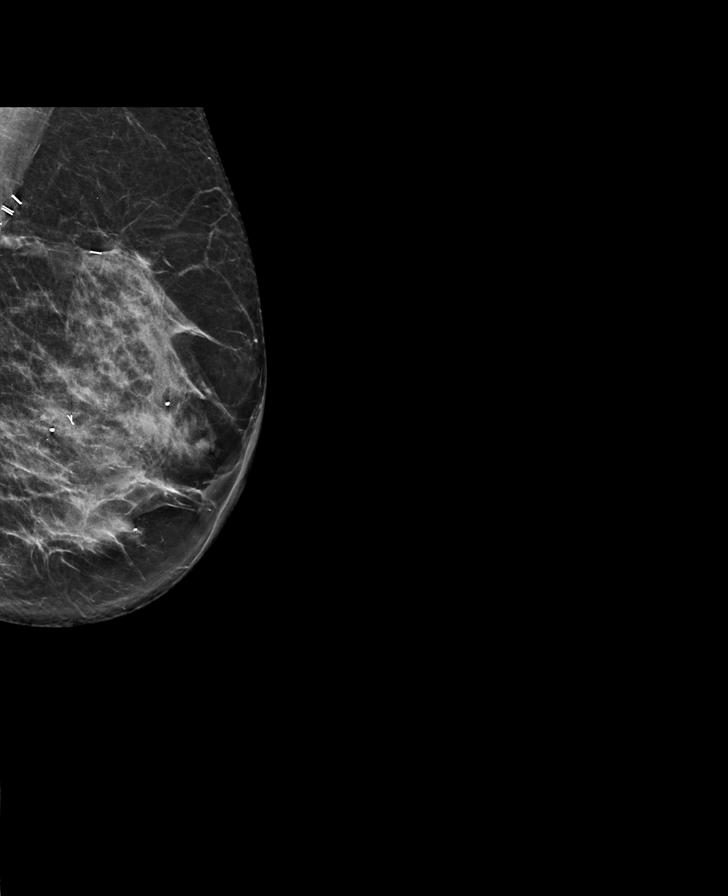

[L MLO tomo · tomo slice 36/71.0]
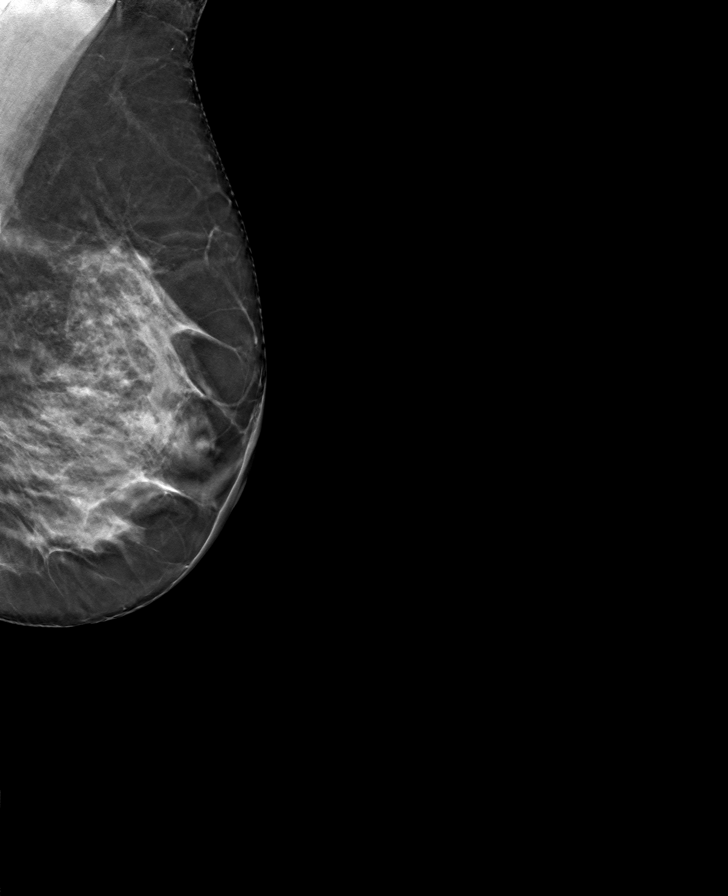

[L TAN tomo · tomo slice 37/74.0]
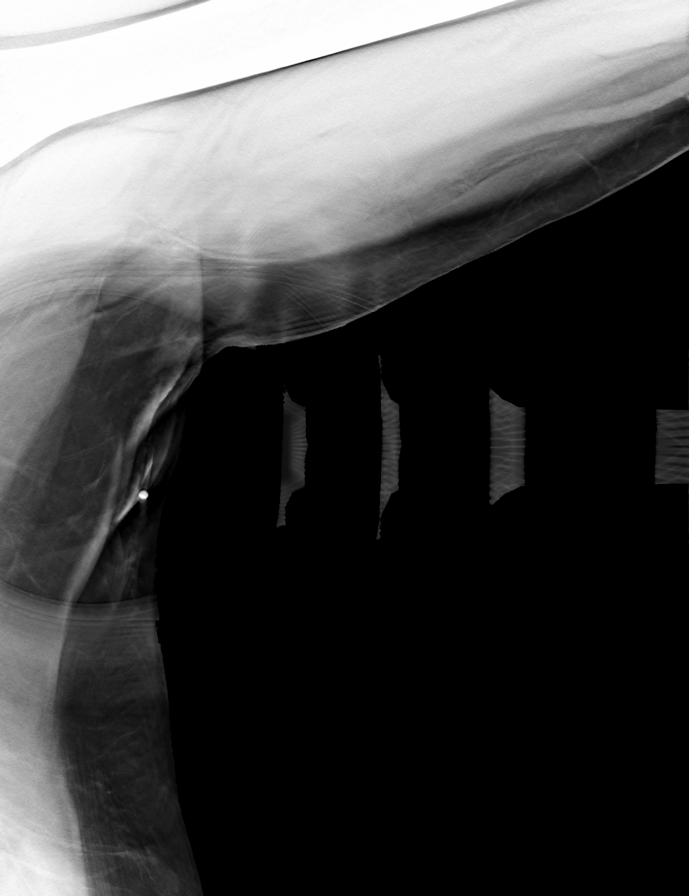

[L CC tomo · tomo slice 38/75.0]
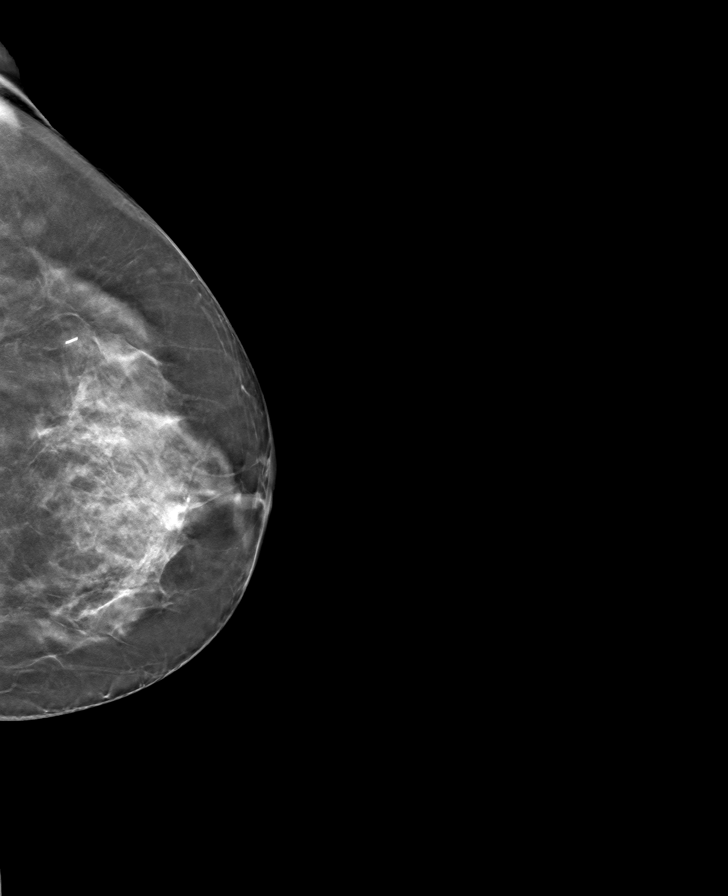

[8 of 20 positions shown; findings below may reference images not displayed]

ACR Breast Density Category c: The breast tissue is heterogeneously
dense, which may obscure small masses.
FINDINGS: There are stable lumpectomy changes in the deep upper outer left
breast. Ribbon shaped biopsy clip is present in the left breast, 4
o'clock region following ultrasound-guided biopsy of a benign
fibroadenoma. No suspicious mass, nonsurgical distortion, or
suspicious microcalcifications identified on left breast. Spot
tangential view of the left axillary region shows a metallic skin
marker in the region of patient concern. No lymphadenopathy or mass
is detected.

Mammographic images were processed with CAD.

On physical exam, there is a healed surgical scar in the mid left
axilla. Superior to this, the patient reports some fullness and
possible mass. I do not palpate a mass. The left axilla fills normal
to palpation. The skin is unremarkable other than the scar.

Targeted ultrasound is performed, showing normal left axillary
contents. Typical sonographic appearance of subcutaneous scarring is
seen in the region of the left axillary surgical scar. There is no
fluid collection, mass, or lymphadenopathy.
IMPRESSION: 1. No evidence of malignancy in the left breast. Lumpectomy changes
in the upper outer quadrant.
2. No evidence of lymphadenopathy or mass in the left axilla. There
are postsurgical changes of prior sentinel lymph node biopsy.

RECOMMENDATION:
Bilateral diagnostic mammogram is recommended in September 2016.

I have discussed the findings and recommendations with the patient.
Results were also provided in writing at the conclusion of the
visit. If applicable, a reminder letter will be sent to the patient
regarding the next appointment.

BI-RADS CATEGORY  2: Benign.

## 2018-07-25 DIAGNOSIS — Z6825 Body mass index (BMI) 25.0-25.9, adult: Secondary | ICD-10-CM | POA: Diagnosis not present

## 2018-07-25 DIAGNOSIS — Z1331 Encounter for screening for depression: Secondary | ICD-10-CM | POA: Diagnosis not present

## 2018-07-25 DIAGNOSIS — Z79899 Other long term (current) drug therapy: Secondary | ICD-10-CM | POA: Diagnosis not present

## 2018-07-25 DIAGNOSIS — I10 Essential (primary) hypertension: Secondary | ICD-10-CM | POA: Diagnosis not present

## 2018-07-25 DIAGNOSIS — R5383 Other fatigue: Secondary | ICD-10-CM | POA: Diagnosis not present

## 2018-08-30 DIAGNOSIS — N951 Menopausal and female climacteric states: Secondary | ICD-10-CM | POA: Diagnosis not present

## 2018-09-05 DIAGNOSIS — R6882 Decreased libido: Secondary | ICD-10-CM | POA: Diagnosis not present

## 2018-09-05 DIAGNOSIS — N898 Other specified noninflammatory disorders of vagina: Secondary | ICD-10-CM | POA: Diagnosis not present

## 2018-09-05 DIAGNOSIS — N951 Menopausal and female climacteric states: Secondary | ICD-10-CM | POA: Diagnosis not present

## 2018-09-05 DIAGNOSIS — Z72820 Sleep deprivation: Secondary | ICD-10-CM | POA: Diagnosis not present

## 2018-09-24 ENCOUNTER — Telehealth: Payer: Self-pay | Admitting: *Deleted

## 2018-09-24 NOTE — Telephone Encounter (Signed)
Records faxed to [facility] MD - att: Anderson Malta - Release FO:[id number]

## 2018-09-27 DIAGNOSIS — Z779 Other contact with and (suspected) exposures hazardous to health: Secondary | ICD-10-CM | POA: Diagnosis not present

## 2018-09-27 DIAGNOSIS — Z01419 Encounter for gynecological examination (general) (routine) without abnormal findings: Secondary | ICD-10-CM | POA: Diagnosis not present

## 2018-09-27 DIAGNOSIS — Z853 Personal history of malignant neoplasm of breast: Secondary | ICD-10-CM | POA: Diagnosis not present

## 2018-09-27 DIAGNOSIS — Z6826 Body mass index (BMI) 26.0-26.9, adult: Secondary | ICD-10-CM | POA: Diagnosis not present

## 2018-10-11 DIAGNOSIS — N951 Menopausal and female climacteric states: Secondary | ICD-10-CM | POA: Diagnosis not present

## 2018-10-11 DIAGNOSIS — R6882 Decreased libido: Secondary | ICD-10-CM | POA: Diagnosis not present

## 2018-10-11 DIAGNOSIS — Z72820 Sleep deprivation: Secondary | ICD-10-CM | POA: Diagnosis not present

## 2018-10-11 DIAGNOSIS — N898 Other specified noninflammatory disorders of vagina: Secondary | ICD-10-CM | POA: Diagnosis not present

## 2018-10-15 DIAGNOSIS — R6882 Decreased libido: Secondary | ICD-10-CM | POA: Diagnosis not present

## 2018-10-15 DIAGNOSIS — Z72820 Sleep deprivation: Secondary | ICD-10-CM | POA: Diagnosis not present

## 2018-10-15 DIAGNOSIS — N951 Menopausal and female climacteric states: Secondary | ICD-10-CM | POA: Diagnosis not present

## 2019-01-16 DIAGNOSIS — R6882 Decreased libido: Secondary | ICD-10-CM | POA: Diagnosis not present

## 2019-01-16 DIAGNOSIS — N951 Menopausal and female climacteric states: Secondary | ICD-10-CM | POA: Diagnosis not present

## 2019-01-16 DIAGNOSIS — Z72820 Sleep deprivation: Secondary | ICD-10-CM | POA: Diagnosis not present

## 2019-03-03 ENCOUNTER — Other Ambulatory Visit: Payer: Self-pay | Admitting: Obstetrics and Gynecology

## 2019-03-03 DIAGNOSIS — Z1231 Encounter for screening mammogram for malignant neoplasm of breast: Secondary | ICD-10-CM

## 2019-04-11 ENCOUNTER — Other Ambulatory Visit: Payer: Self-pay

## 2019-04-11 ENCOUNTER — Ambulatory Visit
Admission: RE | Admit: 2019-04-11 | Discharge: 2019-04-11 | Disposition: A | Discharge status: Home / Self Care | Payer: No Typology Code available for payment source | Source: Ambulatory Visit | Attending: Obstetrics and Gynecology | Admitting: Obstetrics and Gynecology

## 2019-04-11 DIAGNOSIS — Z1231 Encounter for screening mammogram for malignant neoplasm of breast: Secondary | ICD-10-CM

## 2020-03-30 ENCOUNTER — Other Ambulatory Visit: Payer: Self-pay | Admitting: Obstetrics and Gynecology

## 2020-03-30 DIAGNOSIS — Z1231 Encounter for screening mammogram for malignant neoplasm of breast: Secondary | ICD-10-CM

## 2020-05-21 ENCOUNTER — Ambulatory Visit
Admission: RE | Admit: 2020-05-21 | Discharge: 2020-05-21 | Disposition: A | Discharge status: Home / Self Care | Payer: 59 | Source: Ambulatory Visit | Attending: Obstetrics and Gynecology | Admitting: Obstetrics and Gynecology

## 2020-05-21 ENCOUNTER — Other Ambulatory Visit: Payer: Self-pay

## 2020-05-21 DIAGNOSIS — Z1231 Encounter for screening mammogram for malignant neoplasm of breast: Secondary | ICD-10-CM

## 2020-05-24 ENCOUNTER — Other Ambulatory Visit: Payer: Self-pay | Admitting: Obstetrics and Gynecology

## 2020-05-24 DIAGNOSIS — R928 Other abnormal and inconclusive findings on diagnostic imaging of breast: Secondary | ICD-10-CM

## 2020-06-14 ENCOUNTER — Ambulatory Visit
Admission: RE | Admit: 2020-06-14 | Discharge: 2020-06-14 | Disposition: A | Discharge status: Home / Self Care | Payer: 59 | Source: Ambulatory Visit | Attending: Obstetrics and Gynecology | Admitting: Obstetrics and Gynecology

## 2020-06-14 ENCOUNTER — Other Ambulatory Visit: Payer: Self-pay

## 2020-06-14 DIAGNOSIS — R928 Other abnormal and inconclusive findings on diagnostic imaging of breast: Secondary | ICD-10-CM

## 2020-06-18 ENCOUNTER — Other Ambulatory Visit: Payer: Self-pay | Admitting: Obstetrics and Gynecology

## 2020-06-18 DIAGNOSIS — Z853 Personal history of malignant neoplasm of breast: Secondary | ICD-10-CM

## 2020-08-17 ENCOUNTER — Other Ambulatory Visit: Payer: Self-pay

## 2020-08-17 ENCOUNTER — Encounter: Payer: Self-pay | Admitting: Adult Health

## 2020-08-17 ENCOUNTER — Ambulatory Visit
Admission: RE | Admit: 2020-08-17 | Discharge: 2020-08-17 | Disposition: A | Discharge status: Home / Self Care | Payer: 59 | Source: Ambulatory Visit | Attending: Obstetrics and Gynecology | Admitting: Obstetrics and Gynecology

## 2020-08-17 DIAGNOSIS — Z853 Personal history of malignant neoplasm of breast: Secondary | ICD-10-CM

## 2020-08-17 MED ORDER — GADOBUTROL 1 MMOL/ML IV SOLN
7.0000 mL | Freq: Once | INTRAVENOUS | Status: AC | PRN
  Administered 2020-08-17: 7 mL via INTRAVENOUS

## 2021-02-25 DIAGNOSIS — Z6827 Body mass index (BMI) 27.0-27.9, adult: Secondary | ICD-10-CM | POA: Diagnosis not present

## 2021-02-25 DIAGNOSIS — R319 Hematuria, unspecified: Secondary | ICD-10-CM | POA: Diagnosis not present

## 2021-02-25 DIAGNOSIS — Z01419 Encounter for gynecological examination (general) (routine) without abnormal findings: Secondary | ICD-10-CM | POA: Diagnosis not present

## 2021-07-22 DIAGNOSIS — Z1231 Encounter for screening mammogram for malignant neoplasm of breast: Secondary | ICD-10-CM | POA: Diagnosis not present

## 2021-10-27 DIAGNOSIS — R051 Acute cough: Secondary | ICD-10-CM | POA: Diagnosis not present

## 2021-10-27 DIAGNOSIS — I10 Essential (primary) hypertension: Secondary | ICD-10-CM | POA: Diagnosis not present

## 2021-10-27 DIAGNOSIS — J40 Bronchitis, not specified as acute or chronic: Secondary | ICD-10-CM | POA: Diagnosis not present

## 2021-10-27 DIAGNOSIS — J329 Chronic sinusitis, unspecified: Secondary | ICD-10-CM | POA: Diagnosis not present

## 2021-11-07 DIAGNOSIS — K219 Gastro-esophageal reflux disease without esophagitis: Secondary | ICD-10-CM | POA: Diagnosis not present

## 2021-12-09 DIAGNOSIS — K219 Gastro-esophageal reflux disease without esophagitis: Secondary | ICD-10-CM | POA: Diagnosis not present

## 2021-12-09 DIAGNOSIS — K449 Diaphragmatic hernia without obstruction or gangrene: Secondary | ICD-10-CM | POA: Diagnosis not present

## 2021-12-09 DIAGNOSIS — Z1211 Encounter for screening for malignant neoplasm of colon: Secondary | ICD-10-CM | POA: Diagnosis not present

## 2021-12-09 DIAGNOSIS — I10 Essential (primary) hypertension: Secondary | ICD-10-CM | POA: Diagnosis not present

## 2021-12-09 DIAGNOSIS — Z853 Personal history of malignant neoplasm of breast: Secondary | ICD-10-CM | POA: Diagnosis not present

## 2021-12-09 DIAGNOSIS — K5730 Diverticulosis of large intestine without perforation or abscess without bleeding: Secondary | ICD-10-CM | POA: Diagnosis not present

## 2021-12-09 DIAGNOSIS — K209 Esophagitis, unspecified without bleeding: Secondary | ICD-10-CM | POA: Diagnosis not present

## 2022-01-19 DIAGNOSIS — K219 Gastro-esophageal reflux disease without esophagitis: Secondary | ICD-10-CM | POA: Diagnosis not present

## 2022-01-19 DIAGNOSIS — K5790 Diverticulosis of intestine, part unspecified, without perforation or abscess without bleeding: Secondary | ICD-10-CM | POA: Diagnosis not present

## 2022-05-12 DIAGNOSIS — M19031 Primary osteoarthritis, right wrist: Secondary | ICD-10-CM | POA: Diagnosis not present

## 2022-05-12 DIAGNOSIS — M1811 Unilateral primary osteoarthritis of first carpometacarpal joint, right hand: Secondary | ICD-10-CM | POA: Diagnosis not present

## 2022-05-12 DIAGNOSIS — M79646 Pain in unspecified finger(s): Secondary | ICD-10-CM | POA: Diagnosis not present

## 2022-06-09 DIAGNOSIS — M79644 Pain in right finger(s): Secondary | ICD-10-CM | POA: Diagnosis not present

## 2022-06-09 DIAGNOSIS — M19031 Primary osteoarthritis, right wrist: Secondary | ICD-10-CM | POA: Diagnosis not present

## 2022-08-25 DIAGNOSIS — Z01419 Encounter for gynecological examination (general) (routine) without abnormal findings: Secondary | ICD-10-CM | POA: Diagnosis not present

## 2022-08-25 DIAGNOSIS — Z6825 Body mass index (BMI) 25.0-25.9, adult: Secondary | ICD-10-CM | POA: Diagnosis not present

## 2022-08-25 DIAGNOSIS — Z1231 Encounter for screening mammogram for malignant neoplasm of breast: Secondary | ICD-10-CM | POA: Diagnosis not present

## 2022-11-24 DIAGNOSIS — M79604 Pain in right leg: Secondary | ICD-10-CM | POA: Diagnosis not present

## 2022-12-15 DIAGNOSIS — Z6826 Body mass index (BMI) 26.0-26.9, adult: Secondary | ICD-10-CM | POA: Diagnosis not present

## 2022-12-15 DIAGNOSIS — J40 Bronchitis, not specified as acute or chronic: Secondary | ICD-10-CM | POA: Diagnosis not present

## 2022-12-15 DIAGNOSIS — M79604 Pain in right leg: Secondary | ICD-10-CM | POA: Diagnosis not present

## 2023-07-16 ENCOUNTER — Encounter: Payer: Self-pay | Admitting: Adult Health

## 2023-07-25 ENCOUNTER — Other Ambulatory Visit: Payer: Self-pay | Admitting: Oncology

## 2023-07-25 ENCOUNTER — Inpatient Hospital Stay

## 2023-07-25 ENCOUNTER — Inpatient Hospital Stay: Attending: Oncology | Admitting: Oncology

## 2023-07-25 ENCOUNTER — Encounter: Payer: Self-pay | Admitting: Oncology

## 2023-07-25 VITALS — BP 120/73 | HR 78 | Temp 98.0°F | Resp 16 | Ht 64.0 in | Wt 152.8 lb

## 2023-07-25 DIAGNOSIS — Z8249 Family history of ischemic heart disease and other diseases of the circulatory system: Secondary | ICD-10-CM | POA: Diagnosis not present

## 2023-07-25 DIAGNOSIS — Z808 Family history of malignant neoplasm of other organs or systems: Secondary | ICD-10-CM | POA: Insufficient documentation

## 2023-07-25 DIAGNOSIS — Z803 Family history of malignant neoplasm of breast: Secondary | ICD-10-CM | POA: Insufficient documentation

## 2023-07-25 DIAGNOSIS — Z833 Family history of diabetes mellitus: Secondary | ICD-10-CM | POA: Insufficient documentation

## 2023-07-25 DIAGNOSIS — Z809 Family history of malignant neoplasm, unspecified: Secondary | ICD-10-CM | POA: Insufficient documentation

## 2023-07-25 DIAGNOSIS — N6489 Other specified disorders of breast: Secondary | ICD-10-CM | POA: Insufficient documentation

## 2023-07-25 DIAGNOSIS — I890 Lymphedema, not elsewhere classified: Secondary | ICD-10-CM | POA: Insufficient documentation

## 2023-07-25 DIAGNOSIS — Z87891 Personal history of nicotine dependence: Secondary | ICD-10-CM | POA: Diagnosis not present

## 2023-07-25 DIAGNOSIS — R202 Paresthesia of skin: Secondary | ICD-10-CM | POA: Diagnosis not present

## 2023-07-25 DIAGNOSIS — N6332 Unspecified lump in axillary tail of the left breast: Secondary | ICD-10-CM | POA: Diagnosis present

## 2023-07-25 DIAGNOSIS — Z820 Family history of epilepsy and other diseases of the nervous system: Secondary | ICD-10-CM | POA: Insufficient documentation

## 2023-07-25 DIAGNOSIS — Z8049 Family history of malignant neoplasm of other genital organs: Secondary | ICD-10-CM | POA: Insufficient documentation

## 2023-07-25 DIAGNOSIS — C50412 Malignant neoplasm of upper-outer quadrant of left female breast: Secondary | ICD-10-CM

## 2023-07-25 DIAGNOSIS — Z923 Personal history of irradiation: Secondary | ICD-10-CM | POA: Insufficient documentation

## 2023-07-25 DIAGNOSIS — Z79899 Other long term (current) drug therapy: Secondary | ICD-10-CM | POA: Diagnosis not present

## 2023-07-25 DIAGNOSIS — Z853 Personal history of malignant neoplasm of breast: Secondary | ICD-10-CM | POA: Insufficient documentation

## 2023-07-25 DIAGNOSIS — R232 Flushing: Secondary | ICD-10-CM

## 2023-07-25 LAB — CMP (CANCER CENTER ONLY)
ALT: 26 U/L (ref 0–44)
AST: 24 U/L (ref 15–41)
Albumin: 4.6 g/dL (ref 3.5–5.0)
Alkaline Phosphatase: 71 U/L (ref 38–126)
Anion gap: 13 (ref 5–15)
BUN: 10 mg/dL (ref 8–23)
CO2: 22 mmol/L (ref 22–32)
Calcium: 9.9 mg/dL (ref 8.9–10.3)
Chloride: 105 mmol/L (ref 98–111)
Creatinine: 0.8 mg/dL (ref 0.44–1.00)
GFR, Estimated: >60 mL/min (ref >60)
Glucose, Bld: 103 mg/dL — ABNORMAL HIGH (ref 70–99)
Potassium: 4 mmol/L (ref 3.5–5.1)
Sodium: 141 mmol/L (ref 135–145)
Total Bilirubin: 0.3 mg/dL (ref 0.0–1.2)
Total Protein: 6.8 g/dL (ref 6.5–8.1)

## 2023-07-25 LAB — CBC WITH DIFFERENTIAL (CANCER CENTER ONLY)
Abs Immature Granulocytes: 0.04 10*3/uL (ref 0.00–0.07)
Basophils Absolute: 0.1 10*3/uL (ref 0.0–0.1)
Basophils Relative: 1 %
Eosinophils Absolute: 0.2 10*3/uL (ref 0.0–0.5)
Eosinophils Relative: 2 %
HCT: 41.2 % (ref 36.0–46.0)
Hemoglobin: 14 g/dL (ref 12.0–15.0)
Immature Granulocytes: 0 %
Lymphocytes Relative: 30 %
Lymphs Abs: 3.2 10*3/uL (ref 0.7–4.0)
MCH: 30.8 pg (ref 26.0–34.0)
MCHC: 34 g/dL (ref 30.0–36.0)
MCV: 90.5 fL (ref 80.0–100.0)
Monocytes Absolute: 0.6 10*3/uL (ref 0.1–1.0)
Monocytes Relative: 6 %
Neutro Abs: 6.6 10*3/uL (ref 1.7–7.7)
Neutrophils Relative %: 61 %
Platelet Count: 290 10*3/uL (ref 150–400)
RBC: 4.55 MIL/uL (ref 3.87–5.11)
RDW: 12.6 % (ref 11.5–15.5)
WBC Count: 10.6 10*3/uL — ABNORMAL HIGH (ref 4.0–10.5)
nRBC: 0 % (ref 0.0–0.2)

## 2023-07-25 NOTE — Progress Notes (Addendum)
 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 ER and PR were both positive at 100%. Oncotype DX recurrent score was low at 9 corresponding with a risk of recurrence of 7% in the next 9 years and lack of benefit from chemotherapy. She was placed on Tamoxifen  but stopped it after 2 years. She has no evidence of recurrence.   DCIS of the Left Breast This was associated with calcifications and also removed at the time of lumpectomy. In 2017 she had a biopsy of a fibroadenoma of the left breast and negative biopsy of the left axillary node.   Persistent Lump of the Left Axilla I really feel this represents residual from a prior seroma which was drained repeatedly. I do not feel this is malignant but we have to be sure and so I will order an ultrasound of the left axilla and a diagnostic bilateral mammogram. She has areas of firmness in the left breast in the upper central region and axillary tail. There is no evidence of inflammation of this axillary mass. However, I have explained to the patient that there is likely not anything to do to fix this as we could create worse problems.   Minimal Edema of the Upper Left Extremity This may represent very early lymphedema and so I have given her some literature on lymphedema and prevention. I think this is caused by fibrosis of the left axilla due to surgery and radiation rather than any sign of malignancy.   Plan: Chera is seen in the clinic for follow up of a irritated knot in her left axilla. In October, 2014 she had it drained and had it drained weekly for 3 months. I informed her that this appears to be a seroma and explained that this is a benign. I  will schedule an ultrasound of the left axilla and a diagnostic bilateral mammogram for further evaluation. I prefer that this be done through the Breast center. Patient states that she feels well but complains of painful paresthesias on her left upper extremity when she lays on her side. She has persistent discomfort of the lateral left breast, left axilla, and has some swelling in that left axillary region. Patient mention that her last mammogram was done in 2024 at the [facility] and I will request a final report of this. During physical exam I noticed a slight difference in size of her left arm, about 1/2 inch larger in circumference. I provided her with a booklet on lymphedema considering her multiple scar tissue sites. She has a slightly elevated WBC of 10.6, hemoglobin of 14.0, platelet count of 290,000, and her CMP is completely normal. She has not had a bone density scan in years so I will schedule that as well. I will see her back in 1 year for reexamination and screening bilateral mammogram. I have mainly reassured her that I do not think she has cancer and she is at a very low risk of recurrence. I discussed the assessment and treatment plan with the patient.  The patient was provided an opportunity to ask questions and all were answered.  The patient agreed with the plan and demonstrated an understanding of the instructions.  The patient  was advised to call back if the symptoms worsen or if the condition fails to improve as anticipated.  Thank you for the opportunity to care for your patients.   I provided 55 minutes of face-to-face time during this this encounter and > 50% was spent counseling as documented under my assessment and plan.   Wanda VEAR Cornish, MD [facility] - A DEPT OF [facility] [address] [place] [address] Dept: [phone] Dept Fax: [phone]   No orders of the defined types were  placed in this encounter.  I, Jasmine Lassiter, am acting as scribe for Wanda HILARIO Cornish, MD  I have reviewed this report as typed by the medical scribe, and it is complete and accurate.  Wanda VEAR Cornish, MD   7/7/20254:28 PM  CHIEF COMPLAINT:  CC: Stage IA invasive Ductal Carcinoma and DCIS of the Left Breast  Current Treatment:  Surveillance   HISTORY OF PRESENT ILLNESS:  Nimrit Kehres is a 63 y.o. female with a history of invasive ductal carcinoma in situ of the left breast, who is referred in consultation by Dr. Conley for assessment and management of a lump of the left axilla. In 2014 patient was diagnosed with grade 1 stage IA invasive ductal carcinoma and DCIS with calcifications of the left breast. Highly  ER positive at 100%, PR positive at 100%, HER-2/neu negative, with a proliferation marker Ki-67 of 10%. Oncotype DX testing was done on 11/27/12 with a recurrence score of 9. She had a left lumpectomy and radiation done; and had 2 negative nodes and clear margins. Stacy was placed on tamoxifen  but stopped it after 2 years. She developed a seroma of the left axilla and required repeated drainage for the next several months. This has left her with a persistent lump in the left axilla.  In 2017 she was found to have an indeterminate left breast mass at 4 o'clock. Ultrasound guided biopsy revealed this to be a of an fibroadenoma and a biopsy of the left axilla revealed benign lymph nodal tissue. Patient has a history of dense breasts and had routine MRI of the breasts in 2022.   I have reviewed her chart and materials related to her cancer extensively and collaborated history with the patient. Summary of oncologic history is as follows: Oncology History  Malignant neoplasm of upper-outer quadrant of left breast in female, estrogen receptor positive (HCC)  10/24/2012 Initial Diagnosis   Malignant neoplasm of upper-outer quadrant of left breast in female, estrogen receptor  positive (HCC)   11/19/2012 Cancer Staging   Staging form: Breast, AJCC 7th Edition - Pathologic stage from 11/19/2012: Stage IA (T1c, N0(sn), cM0) - Signed by Cornish Wanda VEAR, MD on 07/25/2023 Staged by: Managing physician Diagnostic confirmation: Positive histology Specimen type: Core Needle Biopsy Histopathologic type: Infiltrating duct carcinoma, NOS Stage prefix: Initial diagnosis Laterality: Left Tumor size (mm): 19 Method of lymph node assessment: Sentinel lymph node biopsy Histologic grade (G): G1 Lymph-vascular invasion (LVI): LVI not present (absent)/not identified Residual tumor (R): R0 Paget's disease: Negative Tumor grade (Scarff-Bloom-Richardson system): G1 Estrogen receptor status: Positive Progesterone  receptor status: Positive HER2 status: Negative Multi-gene signature risk of recurrence: Low risk Multi-gene signature score: 9 Prognostic indicators: Ki 67 10% Stage used in treatment planning: Yes National guidelines used in treatment planning: Yes Type of national guideline used in treatment planning: NCCN    INTERVAL HISTORY:  Mette is seen in the clinic for follow up of a  irritated knot in her left axilla. In October, 2014 she had it drained weekly for 3 months. I informed her that this appears to be a seroma and explained that this is a benign. I will schedule an ultrasound of the left axilla and a diagnostic bilateral mammogram for further evaluation. I prefer that this be done through the breast center. Patient states that she feels well but complains of painful paresthesias on her left upper extremity when she lays on her side. She has persistent discomfort of the lateral left breast, left axilla, and has some swelling in that left axillary region. Patient states that her last mammogram was done in 2024 at the [facility] clinic and I will request a final report of this. During physical exam I noticed a slight difference in size of her upper  extremities. The left arm is about 1/2 inch large in circumference when measured at the corresponding length. I provided her with a booklet on lymphedema considering her multiple scar tissue sites. She has a slightly elevated WBC of 10.6, hemoglobin of 14.0, platelet count of 290,000, and her CMP is completely normal. She has not had a bone density scan in years so I will schedule that as well. I will see her back in 1 year for reexamination and screening bilateral mammogram. I have mainly reassured her that I do not think she has cancer and she is at a very low risk of recurrence. She denies fever, chills, night sweats, or other signs of infection. She denies cardiorespiratory and gastrointestinal issues. She denies pain. Her appetite is good. Her weight is 152lbs.   HISTORY:   Past Medical History:  Diagnosis Date   Arthritis    Breast cancer (HCC)    Left breast, 2014   Contact lens/glasses fitting    wears contacts or glasses   Hx of migraines    Hypertension    Personal history of radiation therapy    Trigger finger of thumb    Past Surgical History:  Procedure Laterality Date   BREAST EXCISIONAL BIOPSY Left 1981   BREAST EXCISIONAL BIOPSY Right 1993   BREAST LUMPECTOMY WITH NEEDLE LOCALIZATION AND AXILLARY SENTINEL LYMPH NODE BX Left 11/19/2012   Procedure: BREAST LUMPECTOMY WITH NEEDLE LOCALIZATION AND AXILLARY SENTINEL LYMPH NODE BIOPSY;  Surgeon: Sherlean JINNY Laughter, MD;  Location: [facility];  Service: General;  Laterality: Left;   BUNIONECTOMY  2013   right foot   CESAREAN SECTION     COLONOSCOPY     MINOR FULGERATION OF ANAL CONDYLOMA  2007    Family History  Problem Relation Age of Onset   Breast cancer Mother 58   Hypertension Mother    Diabetes Mother    Melanoma Father 50   Hypertension Father    Diabetes Father    Myasthenia gravis Father    Cervical cancer Maternal Grandmother 84   Cancer Paternal Uncle        Cancer NOS   Diabetes Paternal  Uncle     Social History:  reports that she quit smoking about 14 years ago. Her smoking use included cigarettes. She started smoking about 44 years ago. She has a 9 pack-year smoking history. She has never used smokeless tobacco. She reports current alcohol use of about 7.0 standard drinks of alcohol per week. She reports that she does not use drugs.The patient is alone today.  Allergies: No Known Allergies  Current Medications: Current Outpatient Medications  Medication Sig Dispense Refill   albuterol (VENTOLIN  HFA) 108 (90 Base) MCG/ACT inhaler Inhale into the lungs.     gabapentin  (NEURONTIN ) 400 MG capsule Take 400 mg by mouth at bedtime.     enalapril (VASOTEC) 10 MG tablet Take 10 mg by mouth daily.     No current facility-administered medications for this visit.   REVIEW OF SYSTEMS:  Review of Systems  Constitutional: Negative.  Negative for appetite change, chills, diaphoresis, fatigue, fever and unexpected weight change.  HENT:  Negative.  Negative for hearing loss, lump/mass, mouth sores, nosebleeds, sore throat, tinnitus, trouble swallowing and voice change.   Eyes: Negative.  Negative for eye problems and icterus.  Respiratory: Negative.  Negative for chest tightness, cough, hemoptysis, shortness of breath and wheezing.   Cardiovascular: Negative.  Negative for chest pain, leg swelling and palpitations.  Gastrointestinal: Negative.  Negative for abdominal distention, abdominal pain, blood in stool, constipation, diarrhea, nausea, rectal pain and vomiting.  Endocrine: Negative.   Genitourinary: Negative.  Negative for bladder incontinence, difficulty urinating, dyspareunia, dysuria, frequency, hematuria, menstrual problem, nocturia, pelvic pain, vaginal bleeding and vaginal discharge.   Musculoskeletal: Negative.  Negative for arthralgias, back pain, flank pain, gait problem, myalgias, neck pain and neck stiffness.  Skin: Negative.  Negative for itching, rash and wound.   Neurological:  Positive for numbness (numbness and tingling of the left arm after she sleeps on that side). Negative for dizziness, extremity weakness, gait problem, headaches, light-headedness, seizures and speech difficulty.  Hematological: Negative.  Negative for adenopathy. Does not bruise/bleed easily.  Psychiatric/Behavioral: Negative.  Negative for confusion, decreased concentration, depression, sleep disturbance and suicidal ideas. The patient is not nervous/anxious.       VITALS:  Blood pressure 120/73, pulse 78, temperature 98 F (36.7 C), temperature source Oral, resp. rate 16, height 5' 4 (1.626 m), weight 152 lb 12.8 oz (69.3 kg), SpO2 96%.  Wt Readings from Last 3 Encounters:  07/25/23 152 lb 12.8 oz (69.3 kg)  01/11/17 148 lb 3.2 oz (67.2 kg)  11/22/16 151 lb 11.2 oz (68.8 kg)    Body mass index is 26.23 kg/m.  Performance status (ECOG): 1 - Symptomatic but completely ambulatory  PHYSICAL EXAM:  Physical Exam Vitals and nursing note reviewed.  Constitutional:      General: She is not in acute distress.    Appearance: Normal appearance. She is normal weight. She is not ill-appearing, toxic-appearing or diaphoretic.  HENT:     Head: Normocephalic and atraumatic.     Right Ear: Tympanic membrane, ear canal and external ear normal. There is no impacted cerumen.     Left Ear: Tympanic membrane, ear canal and external ear normal. There is no impacted cerumen.     Nose: Nose normal. No congestion or rhinorrhea.     Mouth/Throat:     Mouth: Mucous membranes are moist.     Pharynx: Oropharynx is clear. No oropharyngeal exudate or posterior oropharyngeal erythema.  Eyes:     General: No scleral icterus.       Right eye: No discharge.        Left eye: No discharge.     Extraocular Movements: Extraocular movements intact.     Conjunctiva/sclera: Conjunctivae normal.     Pupils: Pupils are equal, round, and reactive to light.  Cardiovascular:     Rate and Rhythm: Normal  rate and regular rhythm.     Pulses: Normal pulses.     Heart sounds: Normal heart sounds. No murmur heard.    No friction rub. No gallop.  Pulmonary:     Effort: Pulmonary effort is normal. No respiratory distress.     Breath sounds: Normal breath sounds. No wheezing or rales.  Chest:     Comments: Scar in the upper outer quadrant of the right breast which is well healed.  Firm area in the upper left mid breast Firmness in the axillary tail Well healed scar in the upper outer quadrant of the left breast Scar in the left axilla which is well healed I feel a firm mass like effect in the left axilla which I suspect is a persistent seroma Abdominal:     General: Bowel sounds are normal. There is no distension.     Palpations: Abdomen is soft. There is no hepatomegaly, splenomegaly or mass.     Tenderness: There is no abdominal tenderness.  Musculoskeletal:        General: Normal range of motion.     Cervical back: Normal range of motion and neck supple.     Right lower leg: No edema.     Left lower leg: No edema.     Comments: The right upper extremity was 10 inches in circumference when measured at 5 inches below the [facility] joint.  The left upper extremity was 10.5 inches in circumference at the corresponding site.   Lymphadenopathy:     Cervical: No cervical adenopathy.     Right cervical: No superficial, deep or posterior cervical adenopathy.    Left cervical: No superficial, deep or posterior cervical adenopathy.     Upper Body:     Right upper body: No supraclavicular, axillary or pectoral adenopathy.     Left upper body: No supraclavicular, axillary or pectoral adenopathy.  Skin:    General: Skin is warm and dry.  Neurological:     General: No focal deficit present.     Mental Status: She is alert and oriented to person, place, and time. Mental status is at baseline.  Psychiatric:        Mood and Affect: Mood normal.        Behavior: Behavior normal.        Thought Content:  Thought content normal.        Judgment: Judgment normal.    LABS:      Latest Ref Rng & Units 07/25/2023    3:20 PM 11/22/2016    1:24 PM 11/23/2015    1:13 PM  CBC  WBC 4.0 - 10.5 K/uL 10.6  9.6  7.4   Hemoglobin 12.0 - 15.0 g/dL 85.9  85.8  85.5   Hematocrit 36.0 - 46.0 % 41.2  41.3  42.5   Platelets 150 - 400 K/uL 290  266  264       Latest Ref Rng & Units 07/25/2023    3:20 PM 11/22/2016    1:24 PM 11/23/2015    1:13 PM  CMP  Glucose 70 - 99 mg/dL 896  92  891   BUN 8 - 23 mg/dL 10  9.1  88.6   Creatinine 0.44 - 1.00 mg/dL 9.19  0.8  0.8   Sodium 135 - 145 mmol/L 141  141  141   Potassium 3.5 - 5.1 mmol/L 4.0  4.0  4.0   Chloride 98 - 111 mmol/L 105     CO2 22 - 32 mmol/L 22  24  23    Calcium 8.9 - 10.3 mg/dL 9.9  9.6  9.8   Total Protein 6.5 - 8.1 g/dL 6.8  6.9  6.9   Total  Bilirubin 0.0 - 1.2 mg/dL 0.3  9.45  9.51   Alkaline Phos 38 - 126 U/L 71  63  62   AST 15 - 41 U/L 24  16  18    ALT 0 - 44 U/L 26  16  19    No results found for: TSH, T3TOTAL, T4TOTAL, THYROIDAB No results found for: CEA1, CEA / No results found for: CEA1, CEA No results found for: PSA1 No results found for: CAN199 No results found for: CAN125  No results found for: TOTALPROTELP, ALBUMINELP, A1GS, A2GS, BETS, BETA2SER, GAMS, MSPIKE, SPEI No results found for: TIBC, FERRITIN, IRONPCTSAT No results found for: LDH  STUDIES:  No results found.       I,Jasmine M Lassiter,acting as a scribe for Wanda VEAR Cornish, MD.,have documented all relevant documentation on the behalf of Wanda VEAR Cornish, MD,as directed by  Wanda VEAR Cornish, MD while in the presence of Wanda VEAR Cornish, MD.

## 2023-08-06 ENCOUNTER — Encounter: Payer: Self-pay | Admitting: Adult Health

## 2023-08-06 ENCOUNTER — Ambulatory Visit (HOSPITAL_BASED_OUTPATIENT_CLINIC_OR_DEPARTMENT_OTHER)
Admission: RE | Admit: 2023-08-06 | Discharge: 2023-08-06 | Disposition: A | Discharge status: Home / Self Care | Source: Ambulatory Visit | Attending: Oncology | Admitting: Oncology

## 2023-08-06 ENCOUNTER — Encounter (HOSPITAL_BASED_OUTPATIENT_CLINIC_OR_DEPARTMENT_OTHER): Payer: Self-pay | Admitting: Radiology

## 2023-08-06 DIAGNOSIS — C50412 Malignant neoplasm of upper-outer quadrant of left female breast: Secondary | ICD-10-CM | POA: Diagnosis not present

## 2023-08-06 DIAGNOSIS — Z170 Estrogen receptor positive status [ER+]: Secondary | ICD-10-CM | POA: Diagnosis not present

## 2023-08-07 ENCOUNTER — Ambulatory Visit
Admission: RE | Admit: 2023-08-07 | Discharge: 2023-08-07 | Disposition: A | Discharge status: Home / Self Care | Source: Ambulatory Visit | Attending: Oncology | Admitting: Oncology

## 2023-08-07 DIAGNOSIS — C50412 Malignant neoplasm of upper-outer quadrant of left female breast: Secondary | ICD-10-CM

## 2024-08-22 ENCOUNTER — Ambulatory Visit: Admitting: Oncology
